# Patient Record
Sex: Female | Born: 1975 | Race: White | Hispanic: No | Smoking: Current every day smoker
Health system: Southern US, Community
[De-identification: ages and names within clinical notes are randomized; demographics above are authoritative.]

## PROBLEM LIST (undated history)

## (undated) DIAGNOSIS — I10 Essential (primary) hypertension: Secondary | ICD-10-CM

## (undated) DIAGNOSIS — I517 Cardiomegaly: Secondary | ICD-10-CM

## (undated) DIAGNOSIS — M7989 Other specified soft tissue disorders: Secondary | ICD-10-CM

## (undated) DIAGNOSIS — R110 Nausea: Secondary | ICD-10-CM

## (undated) DIAGNOSIS — R0789 Other chest pain: Secondary | ICD-10-CM

## (undated) DIAGNOSIS — R0600 Dyspnea, unspecified: Secondary | ICD-10-CM

## (undated) DIAGNOSIS — R0609 Other forms of dyspnea: Secondary | ICD-10-CM

## (undated) DIAGNOSIS — R079 Chest pain, unspecified: Secondary | ICD-10-CM

## (undated) HISTORY — DX: Chest pain, unspecified: R07.9

## (undated) HISTORY — DX: Nausea: R11.0

## (undated) HISTORY — DX: Other specified soft tissue disorders: M79.89

## (undated) HISTORY — DX: Dyspnea, unspecified: R06.00

## (undated) HISTORY — DX: Cardiomegaly: I51.7

## (undated) HISTORY — DX: Essential (primary) hypertension: I10

## (undated) HISTORY — DX: Other forms of dyspnea: R06.09

## (undated) HISTORY — DX: Other chest pain: R07.89

---
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
## 1996-11-16 HISTORY — PX: TUBAL LIGATION: SHX77

## 2001-04-18 HISTORY — PX: THYROIDECTOMY: SHX17

## 2012-04-18 HISTORY — PX: PAROTID GLAND TUMOR EXCISION: SHX5221

## 2017-08-16 HISTORY — PX: VAGINAL MASS EXCISION: SHX2640

## 2019-12-09 ENCOUNTER — Other Ambulatory Visit: Payer: Self-pay

## 2019-12-09 ENCOUNTER — Emergency Department (HOSPITAL_COMMUNITY)
Admission: EM | Admit: 2019-12-09 | Discharge: 2019-12-10 | Disposition: A | Discharge status: Home / Self Care | Payer: 59 | Attending: Emergency Medicine | Admitting: Emergency Medicine

## 2019-12-09 DIAGNOSIS — R0609 Other forms of dyspnea: Secondary | ICD-10-CM | POA: Diagnosis not present

## 2019-12-09 DIAGNOSIS — R609 Edema, unspecified: Secondary | ICD-10-CM | POA: Insufficient documentation

## 2019-12-09 DIAGNOSIS — Z20822 Contact with and (suspected) exposure to covid-19: Secondary | ICD-10-CM | POA: Diagnosis not present

## 2019-12-09 DIAGNOSIS — R0600 Dyspnea, unspecified: Secondary | ICD-10-CM

## 2019-12-09 DIAGNOSIS — R0789 Other chest pain: Secondary | ICD-10-CM | POA: Diagnosis not present

## 2019-12-10 ENCOUNTER — Emergency Department (HOSPITAL_COMMUNITY): Payer: 59

## 2019-12-10 ENCOUNTER — Other Ambulatory Visit: Payer: Self-pay

## 2019-12-10 LAB — BASIC METABOLIC PANEL
Anion gap: 12 (ref 5–15)
BUN: 11 mg/dL (ref 6–20)
CO2: 28 mmol/L (ref 22–32)
Calcium: 9.1 mg/dL (ref 8.9–10.3)
Chloride: 98 mmol/L (ref 98–111)
Creatinine, Ser: 0.92 mg/dL (ref 0.44–1.00)
GFR calc Af Amer: >60 mL/min (ref >60)
GFR calc non Af Amer: >60 mL/min (ref >60)
Glucose, Bld: 141 mg/dL — ABNORMAL HIGH (ref 70–99)
Potassium: 3 mmol/L — ABNORMAL LOW (ref 3.5–5.1)
Sodium: 138 mmol/L (ref 135–145)

## 2019-12-10 LAB — I-STAT BETA HCG BLOOD, ED (MC, WL, AP ONLY): I-stat hCG, quantitative: <5 m[IU]/mL (ref <5)

## 2019-12-10 LAB — SARS CORONAVIRUS 2 BY RT PCR (HOSPITAL ORDER, PERFORMED IN ~~LOC~~ HOSPITAL LAB): SARS Coronavirus 2: NEGATIVE

## 2019-12-10 LAB — BRAIN NATRIURETIC PEPTIDE: B Natriuretic Peptide: 32.4 pg/mL (ref 0.0–100.0)

## 2019-12-10 LAB — TROPONIN I (HIGH SENSITIVITY)
Troponin I (High Sensitivity): 4 ng/L (ref <18)
Troponin I (High Sensitivity): 5 ng/L (ref <18)
Troponin I (High Sensitivity): 3 ng/L (ref <18)

## 2019-12-10 LAB — CBC
HCT: 44.3 % (ref 36.0–46.0)
Hemoglobin: 14.9 g/dL (ref 12.0–15.0)
MCH: 31.2 pg (ref 26.0–34.0)
MCHC: 33.6 g/dL (ref 30.0–36.0)
MCV: 92.9 fL (ref 80.0–100.0)
Platelets: 296 10*3/uL (ref 150–400)
RBC: 4.77 MIL/uL (ref 3.87–5.11)
RDW: 14.2 % (ref 11.5–15.5)
WBC: 13.6 10*3/uL — ABNORMAL HIGH (ref 4.0–10.5)
nRBC: 0 % (ref 0.0–0.2)

## 2019-12-10 MED ORDER — NITROGLYCERIN 2 % TD OINT
0.5000 [in_us] | TOPICAL_OINTMENT | Freq: Once | TRANSDERMAL | Status: AC
  Administered 2019-12-10: 0.5 [in_us] via TOPICAL
  Filled 2019-12-10: qty 1

## 2019-12-10 NOTE — ED Triage Notes (Signed)
Pt presents to ED POV. Pt c/o nausea, HA, weakness, CP. Pt reports that it began x2d ago. Pt NAD in triage

## 2019-12-10 NOTE — Discharge Instructions (Signed)
Your testing today has been normal, there is no signs of heart attack, no signs of heart failure, please follow-up with the heart specialist listed above or the heart specialist of your choice within the next 72 hours, call for the next available follow-up but please return to the emergency department for worsening symptoms, I would also like you to start taking a baby aspirin daily

## 2019-12-10 NOTE — ED Provider Notes (Signed)
[facility] EMERGENCY DEPARTMENT Provider Note   CSN: [id number] Arrival date & time: 12/09/19  2103     History Chief Complaint  Patient presents with  . Chest Pain  . Nausea    Emily Horn is a 44 y.o. female.  HPI   This patient is a very pleasant 44 year old female, she has recently been diagnosed with hypertension and started on a lisinopril hydrochlorothiazide combination pill, she states that she smokes "at least 1 pack of cigarettes per day".  She has no other medical history other than a history of diabetes for which she states she was misdiagnosed and taken off medications.  She reports that she has recently developed several symptoms including some chest heaviness, shortness of breath and dyspnea on exertion as well as some swelling of her legs.  She went to the doctor for the first time to establish care at [facility] approximately 1-1/2 weeks ago during which time she states she had a low-grade fever.  She has not had any other significant infectious symptoms, no diarrhea dysuria nausea or vomiting.  She has had some occasional cough which she states is nonproductive.  She has not been tested for Covid in months, she has not had any rashes.  She has not had any prior work-up for cardiac disease.  She does report that she has some difficulty with climbing stairs and lives on the third floor thus making it difficult to get up and down.  This is not necessarily new, she does not exercise at all.  She has never had any provocative testing of her heart.  She does have a father who had significant heart disease in his 61s.  No past medical history on file.  There are no problems to display for this patient.     OB History   No obstetric history on file.     No family history on file.  Social History   Tobacco Use  . Smoking status: Not on file  Substance Use Topics  . Alcohol use: Not on file  . Drug use: Not on file    Home  Medications Prior to Admission medications   Not on File    Allergies    Aspirin and Geodon [ziprasidone]  Review of Systems   Review of Systems  All other systems reviewed and are negative.   Physical Exam Updated Vital Signs BP 114/72 (BP Location: Right Arm)   Pulse 72   Temp 98 F (36.7 C) (Oral)   Resp 15   Ht 1.702 m (5\' 7" )   Wt 122.5 kg   SpO2 96%   BMI 42.29 kg/m   Physical Exam Vitals and nursing note reviewed.  Constitutional:      General: She is not in acute distress.    Appearance: She is well-developed.  HENT:     Head: Normocephalic and atraumatic.     Mouth/Throat:     Pharynx: No oropharyngeal exudate.  Eyes:     General: No scleral icterus.       Right eye: No discharge.        Left eye: No discharge.     Conjunctiva/sclera: Conjunctivae normal.     Pupils: Pupils are equal, round, and reactive to light.  Neck:     Thyroid: No thyromegaly.     Vascular: No JVD.  Cardiovascular:     Rate and Rhythm: Normal rate and regular rhythm.     Heart sounds: Normal heart sounds. No murmur  heard.  No friction rub. No gallop.   Pulmonary:     Effort: Pulmonary effort is normal. No respiratory distress.     Breath sounds: Normal breath sounds. No wheezing or rales.  Abdominal:     General: Bowel sounds are normal. There is no distension.     Palpations: Abdomen is soft. There is no mass.     Tenderness: There is no abdominal tenderness.  Musculoskeletal:        General: No tenderness. Normal range of motion.     Cervical back: Normal range of motion and neck supple.     Right lower leg: No tenderness. Edema present.     Left lower leg: No tenderness. Edema present.  Lymphadenopathy:     Cervical: No cervical adenopathy.  Skin:    General: Skin is warm and dry.     Findings: No erythema or rash.  Neurological:     Mental Status: She is alert.     Coordination: Coordination normal.  Psychiatric:        Behavior: Behavior normal.     ED  Results / Procedures / Treatments   Labs (all labs ordered are listed, but only abnormal results are displayed) Labs Reviewed  BASIC METABOLIC PANEL - Abnormal; Notable for the following components:      Result Value   Potassium 3.0 (*)    Glucose, Bld 141 (*)    All other components within normal limits  CBC - Abnormal; Notable for the following components:   WBC 13.6 (*)    All other components within normal limits  SARS CORONAVIRUS 2 BY RT PCR (HOSPITAL ORDER, PERFORMED IN [facility])  BRAIN NATRIURETIC PEPTIDE  I-STAT BETA HCG BLOOD, ED ([facility], [facility], [facility] ONLY)  TROPONIN I (HIGH SENSITIVITY)  TROPONIN I (HIGH SENSITIVITY)  TROPONIN I (HIGH SENSITIVITY)    EKG EKG Interpretation  Date/Time:  Tuesday December 10 2019 11:52:51 EDT Ventricular Rate:  72 PR Interval:  170 QRS Duration: 105 QT Interval:  406 QTC Calculation: 445 R Axis:   55 Text Interpretation: Sinus rhythm Low voltage, precordial leads normal otherwise. Confirmed by Eber Hong ([id number]) on 12/10/2019 12:04:27 PM   Radiology DG Chest 2 View  Result Date: 12/10/2019 CLINICAL DATA:  Initial evaluation for acute chest pain. EXAM: CHEST - 2 VIEW COMPARISON:  None available. FINDINGS: Mild cardiomegaly.  Mediastinal silhouette within normal limits. Lungs normally inflated. Diffuse vascular and interstitial prominence is seen, favored to reflect mild diffuse pulmonary interstitial congestion. Sequelae of atypical/viral pneumonitis could also have this appearance. No frank airspace consolidation. No pleural effusion. No pneumothorax. No acute osseous finding. IMPRESSION: 1. Diffuse vascular and interstitial prominence, favored to reflect mild diffuse pulmonary interstitial congestion. Sequelae of atypical/viral pneumonitis could also have this appearance. 2. Mild cardiomegaly. Electronically Signed   By: Rise Mu M.D.   On: 12/10/2019 00:59    Procedures Procedures (including critical care  time)  Medications Ordered in ED Medications  nitroGLYCERIN (NITROGLYN) 2 % ointment 0.5 inch (0.5 inches Topical Given 12/10/19 1204)    ED Course  I have reviewed the triage vital signs and the nursing notes.  Pertinent labs & imaging results that were available during my care of the patient were reviewed by me and considered in my medical decision making (see chart for details).    MDM Rules/Calculators/A&P                          This  patient symptoms do raise concern for cardiac etiology with both dyspnea on exertion edema and chest heaviness.  She states that the chest heaviness had been intermittent with some occasional palpitations in the past but overnight it is been constant for at least 12 hours.  She has been waiting in the waiting room and during that time she has had multiple labs done including a troponin which came back at 5, she is not pregnant, her metabolic panel and CBC are unremarkable other than a mild leukocytosis of 13,000 and a potassium of 3.0.  She has a chest x-ray which I personally viewed and interpreted which shows some vascular congestion but no significant findings of pneumothorax or significant pulmonary edema effusion cardiomegaly or infiltrate.  At this time we will repeat the troponin, BNP, repeat EKG, she may need follow-up in the outpatient setting for stress test.  We will also rule out Covid  Covid test is negative, 3 troponins have all been negative, BNP is 32, no signs of complicating pulmonary conditions and EKGs which have been overall unremarkable without any evolving signs or symptoms of ischemia or arrhythmia.  At this time I feel the patient is stable for discharge, she has been given follow-up information for cardiology and can call to make an appointment within the next several days.  She understands indications for return.  She is agreeable, I have recommended that she stop smoking immediately  Tobacco cessation counseling given.  Final  Clinical Impression(s) / ED Diagnoses Final diagnoses:  Chest heaviness  Dyspnea on exertion    Rx / DC Orders ED Discharge Orders    None       Eber Hong, MD 12/10/19 1225

## 2019-12-30 ENCOUNTER — Other Ambulatory Visit: Payer: Self-pay | Admitting: *Deleted

## 2019-12-30 ENCOUNTER — Ambulatory Visit: Payer: 59 | Admitting: Internal Medicine

## 2019-12-30 ENCOUNTER — Encounter: Payer: Self-pay | Admitting: *Deleted

## 2019-12-30 NOTE — Progress Notes (Deleted)
Cardiology Office Note:    Date:  12/30/2019   ID:  Emily Horn, DOB [birth year], MRN [id number]  PCP:  [facility]  [facility] Cardiologist:  No primary care provider on file.  [facility] Electrophysiologist:  None   Referring MD: Pa, [facility]*   No chief complaint on file. ***  History of Present Illness:    Emily Horn is a 44 y.o. female with a hx of ***  Past Medical History:  Diagnosis Date  . Chest heaviness   . Chest pain   . Exertional dyspnea   . Hypertension   . Mild cardiomegaly   . Nausea   . Swelling of limb     *** The histories are not reviewed yet. Please review them in the "History" navigator section and refresh this SmartLink.  Current Medications: No outpatient medications have been marked as taking for the 12/30/19 encounter (Appointment) with Christell Constant, MD.     Allergies:   Aspirin and Geodon [ziprasidone]   Social History   Socioeconomic History  . Marital status: Single    Spouse name: Not on file  . Number of children: Not on file  . Years of education: Not on file  . Highest education level: Not on file  Occupational History  . Not on file  Tobacco Use  . Smoking status: Not on file  Substance and Sexual Activity  . Alcohol use: Not on file  . Drug use: Not on file  . Sexual activity: Not on file  Other Topics Concern  . Not on file  Social History Narrative  . Not on file   Social Determinants of Health   Financial Resource Strain:   . Difficulty of Paying Living Expenses: Not on file  Food Insecurity:   . Worried About Programme researcher, broadcasting/film/video in the Last Year: Not on file  . Ran Out of Food in the Last Year: Not on file  Transportation Needs:   . Lack of Transportation (Medical): Not on file  . Lack of Transportation (Non-Medical): Not on file  Physical Activity:   . Days of Exercise per Week: Not on file  . Minutes of Exercise per Session: Not on file  Stress:     . Feeling of Stress : Not on file  Social Connections:   . Frequency of Communication with Friends and Family: Not on file  . Frequency of Social Gatherings with Friends and Family: Not on file  . Attends Religious Services: Not on file  . Active Member of Clubs or Organizations: Not on file  . Attends Banker Meetings: Not on file  . Marital Status: Not on file     Family History: The patient's ***family history includes Heart disease in her father.  ROS:   Please see the history of present illness.    *** All other systems reviewed and are negative.  EKGs/Labs/Other Studies Reviewed:    The following studies were reviewed today: ***  EKG:  EKG is *** ordered today.  The ekg ordered today demonstrates *** 12/10/19- Sr, Nonspecific ST changes, rate 85  Recent Labs: 12/10/2019: B Natriuretic Peptide 32.4; BUN 11; Creatinine, Ser 0.92; Hemoglobin 14.9; Platelets 296; Potassium 3.0; Sodium 138  Recent Lipid Panel No results found for: CHOL, TRIG, HDL, CHOLHDL, VLDL, LDLCALC, LDLDIRECT  Physical Exam:    VS:  There were no vitals taken for this visit.    Wt Readings from Last 3 Encounters:  12/10/19 270  lb (122.5 kg)     GEN: *** Well nourished, well developed in no acute distress HEENT: Normal NECK: No JVD; No carotid bruits LYMPHATICS: No lymphadenopathy CARDIAC: ***RRR, no murmurs, rubs, gallops RESPIRATORY:  Clear to auscultation without rales, wheezing or rhonchi  ABDOMEN: Soft, non-tender, non-distended MUSCULOSKELETAL:  No edema; No deformity  SKIN: Warm and dry NEUROLOGIC:  Alert and oriented x 3 PSYCHIATRIC:  Normal affect   ASSESSMENT:    No diagnosis found. PLAN:    In order of problems listed above:  1. Peroperative surgical evaluation 2. Chest pain   Medication Adjustments/Labs and Tests Ordered: Current medicines are reviewed at length with the patient today.  Concerns regarding medicines are outlined above.  No orders of the  defined types were placed in this encounter.  No orders of the defined types were placed in this encounter.   There are no Patient Instructions on file for this visit.   Signed, Christell Constant, MD  12/30/2019 8:39 AM    [facility]

## 2020-02-06 ENCOUNTER — Ambulatory Visit: Payer: 59 | Admitting: Internal Medicine

## 2020-02-06 NOTE — Progress Notes (Deleted)
Cardiology Office Note:    Date:  02/06/2020   ID:  Emily Horn, DOB 1975/07/20, MRN [id number]  PCP:  [facility]  [facility] Cardiologist:  No primary care provider on file.  [facility] Electrophysiologist:  None   Referring MD: Pa, [facility]*   CC: Chest Pressure Consulted for the evaluation of chest pressure at the behest of Pa, [facility]  History of Present Illness:    Emily Horn is a 44 y.o. female with a hx of HTN who presents with chest pressure.  Patient notes ***.  Past Medical History:  Diagnosis Date  . Chest heaviness   . Chest pain   . Exertional dyspnea   . Hypertension   . Mild cardiomegaly   . Nausea   . Swelling of limb    Current Medications: No outpatient medications have been marked as taking for the 02/06/20 encounter (Appointment) with Christell Constant, MD.    Allergies:   Aspirin and Geodon [ziprasidone]   Social History   Socioeconomic History  . Marital status: Divorced    Spouse name: Not on file  . Number of children: 2  . Years of education: Not on file  . Highest education level: Not on file  Occupational History  . Occupation: home health aide    Employer: OTHER  Tobacco Use  . Smoking status: Current Every Day Smoker    Types: Cigarettes  . Smokeless tobacco: Never Used  Vaping Use  . Vaping Use: Never assessed  Substance and Sexual Activity  . Alcohol use: Yes  . Drug use: Yes  . Sexual activity: Not on file  Other Topics Concern  . Not on file  Social History Narrative  . Not on file   Social Determinants of Health   Financial Resource Strain:   . Difficulty of Paying Living Expenses: Not on file  Food Insecurity:   . Worried About Programme researcher, broadcasting/film/video in the Last Year: Not on file  . Ran Out of Food in the Last Year: Not on file  Transportation Needs:   . Lack of Transportation (Medical): Not on file  . Lack of Transportation (Non-Medical):  Not on file  Physical Activity:   . Days of Exercise per Week: Not on file  . Minutes of Exercise per Session: Not on file  Stress:   . Feeling of Stress : Not on file  Social Connections:   . Frequency of Communication with Friends and Family: Not on file  . Frequency of Social Gatherings with Friends and Family: Not on file  . Attends Religious Services: Not on file  . Active Member of Clubs or Organizations: Not on file  . Attends Banker Meetings: Not on file  . Marital Status: Not on file    Family History: The patient's family history includes Breast cancer in her maternal grandmother; Cancer in her brother; Colon cancer in her paternal grandmother; Diabetes in her father; Diabetes type II in her mother; Heart attack (age of onset: 77) in her father; Heart disease in her father and maternal grandfather; Hypercholesterolemia in her mother; Hypertension in her father and maternal grandfather; Throat cancer in her paternal grandfather; Vision loss in her father and mother.  ROS:   Please see the history of present illness.    All other systems reviewed and are negative.  EKGs/Labs/Other Studies Reviewed:    The following studies were reviewed today:  EKG:   SR rate 85 no  q waves  Recent Labs: 12/10/2019: B Natriuretic Peptide 32.4; BUN 11; Creatinine, Ser 0.92; Hemoglobin 14.9; Platelets 296; Potassium 3.0; Sodium 138   Physical Exam:    VS:  There were no vitals taken for this visit.    Wt Readings from Last 3 Encounters:  12/10/19 270 lb (122.5 kg)    GEN: Well nourished, well developed in no acute distress HEENT: Normal NECK: No JVD; No carotid bruits LYMPHATICS: No lymphadenopathy CARDIAC: RRR, no murmurs, rubs, gallops RESPIRATORY:  Clear to auscultation without rales, wheezing or rhonchi  ABDOMEN: Soft, non-tender, non-distended MUSCULOSKELETAL:  No edema; No deformity  SKIN: Warm and dry NEUROLOGIC:  Alert and oriented x 3 PSYCHIATRIC:  Normal  affect   ASSESSMENT:    No diagnosis found. PLAN:    In order of problems listed above:  Chest Pressure HTN -   Medication Adjustments/Labs and Tests Ordered: Current medicines are reviewed at length with the patient today.  Concerns regarding medicines are outlined above.  No orders of the defined types were placed in this encounter.  No orders of the defined types were placed in this encounter.   There are no Patient Instructions on file for this visit.   Signed, Christell Constant, MD  02/06/2020 10:23 AM    [facility]

## 2020-02-14 ENCOUNTER — Encounter: Payer: Self-pay | Admitting: General Practice

## 2020-06-04 ENCOUNTER — Emergency Department (HOSPITAL_COMMUNITY): Payer: Medicaid Other

## 2020-06-04 ENCOUNTER — Encounter (HOSPITAL_COMMUNITY): Payer: Self-pay

## 2020-06-04 ENCOUNTER — Emergency Department (HOSPITAL_COMMUNITY)
Admission: EM | Admit: 2020-06-04 | Discharge: 2020-06-04 | Disposition: A | Discharge status: Home / Self Care | Payer: Medicaid Other | Attending: Emergency Medicine | Admitting: Emergency Medicine

## 2020-06-04 ENCOUNTER — Other Ambulatory Visit: Payer: Self-pay

## 2020-06-04 DIAGNOSIS — F17210 Nicotine dependence, cigarettes, uncomplicated: Secondary | ICD-10-CM | POA: Insufficient documentation

## 2020-06-04 DIAGNOSIS — R21 Rash and other nonspecific skin eruption: Secondary | ICD-10-CM | POA: Insufficient documentation

## 2020-06-04 DIAGNOSIS — Z79899 Other long term (current) drug therapy: Secondary | ICD-10-CM | POA: Insufficient documentation

## 2020-06-04 DIAGNOSIS — I10 Essential (primary) hypertension: Secondary | ICD-10-CM | POA: Insufficient documentation

## 2020-06-04 LAB — COMPREHENSIVE METABOLIC PANEL
ALT: 12 U/L (ref 0–44)
AST: 19 U/L (ref 15–41)
Albumin: 3.8 g/dL (ref 3.5–5.0)
Alkaline Phosphatase: 53 U/L (ref 38–126)
Anion gap: 11 (ref 5–15)
BUN: 11 mg/dL (ref 6–20)
CO2: 27 mmol/L (ref 22–32)
Calcium: 10.1 mg/dL (ref 8.9–10.3)
Chloride: 98 mmol/L (ref 98–111)
Creatinine, Ser: 0.83 mg/dL (ref 0.44–1.00)
GFR, Estimated: >60 mL/min (ref >60)
Glucose, Bld: 170 mg/dL — ABNORMAL HIGH (ref 70–99)
Potassium: 3.3 mmol/L — ABNORMAL LOW (ref 3.5–5.1)
Sodium: 136 mmol/L (ref 135–145)
Total Bilirubin: 0.7 mg/dL (ref 0.3–1.2)
Total Protein: 6.7 g/dL (ref 6.5–8.1)

## 2020-06-04 LAB — CBC WITH DIFFERENTIAL/PLATELET
Abs Immature Granulocytes: 0.06 10*3/uL (ref 0.00–0.07)
Basophils Absolute: 0.1 10*3/uL (ref 0.0–0.1)
Basophils Relative: 0 %
Eosinophils Absolute: 0.7 10*3/uL — ABNORMAL HIGH (ref 0.0–0.5)
Eosinophils Relative: 5 %
HCT: 44.8 % (ref 36.0–46.0)
Hemoglobin: 14.9 g/dL (ref 12.0–15.0)
Immature Granulocytes: 0 %
Lymphocytes Relative: 25 %
Lymphs Abs: 3.7 10*3/uL (ref 0.7–4.0)
MCH: 31 pg (ref 26.0–34.0)
MCHC: 33.3 g/dL (ref 30.0–36.0)
MCV: 93.1 fL (ref 80.0–100.0)
Monocytes Absolute: 0.5 10*3/uL (ref 0.1–1.0)
Monocytes Relative: 4 %
Neutro Abs: 9.4 10*3/uL — ABNORMAL HIGH (ref 1.7–7.7)
Neutrophils Relative %: 66 %
Platelets: 277 10*3/uL (ref 150–400)
RBC: 4.81 MIL/uL (ref 3.87–5.11)
RDW: 14.1 % (ref 11.5–15.5)
WBC: 14.4 10*3/uL — ABNORMAL HIGH (ref 4.0–10.5)
nRBC: 0 % (ref 0.0–0.2)

## 2020-06-04 LAB — URINALYSIS, ROUTINE W REFLEX MICROSCOPIC
Bilirubin Urine: NEGATIVE
Glucose, UA: NEGATIVE mg/dL
Hgb urine dipstick: NEGATIVE
Ketones, ur: NEGATIVE mg/dL
Nitrite: NEGATIVE
Protein, ur: NEGATIVE mg/dL
Specific Gravity, Urine: 1.017 (ref 1.005–1.030)
pH: 5 (ref 5.0–8.0)

## 2020-06-04 LAB — I-STAT BETA HCG BLOOD, ED (MC, WL, AP ONLY): I-stat hCG, quantitative: <5 m[IU]/mL (ref <5)

## 2020-06-04 LAB — LACTIC ACID, PLASMA: Lactic Acid, Venous: 1.2 mmol/L (ref 0.5–1.9)

## 2020-06-04 MED ORDER — DIPHENHYDRAMINE HCL 25 MG PO TABS: 25.0000 mg | ORAL_TABLET | Freq: Four times a day (QID) | ORAL | 0 refills | Status: AC | PRN

## 2020-06-04 MED ORDER — PENICILLIN G BENZATHINE 1200000 UNIT/2ML IM SUSP
2.4000 10*6.[IU] | Freq: Once | INTRAMUSCULAR | Status: AC
  Administered 2020-06-04: 2.4 10*6.[IU] via INTRAMUSCULAR
  Filled 2020-06-04: qty 4

## 2020-06-04 MED ORDER — PREDNISONE 20 MG PO TABS: ORAL_TABLET | ORAL | 0 refills | Status: AC

## 2020-06-04 NOTE — Discharge Instructions (Signed)
You have been evaluated for your rash.  It is likely an allergic rash.  Take prednisone and benadryl as prescribed.  A syphilis test have been obtained, check result through MyChart, link below.  Continue taking Doxycycline previously prescribed.  Return if you have any concerns.

## 2020-06-04 NOTE — ED Notes (Signed)
Discharge instructions explained and patient verbalized understanding.  

## 2020-06-04 NOTE — ED Triage Notes (Signed)
Patient abscess to L thigh, reports it popped on its own and she went to her PCP who gave her doxy and cream to put on it. Reports he marked the redness with skin marker it is has spread beyond marking, now also reports rash to BLE.

## 2020-06-04 NOTE — ED Provider Notes (Signed)
[facility] EMERGENCY DEPARTMENT Provider Note   CSN: [id number] Arrival date & time: 06/04/20  8115     History Chief Complaint  Patient presents with  . Rash  . Abscess    Emily Horn is a 45 y.o. female.  The history is provided by the patient. No language interpreter was used.  Rash Abscess     45 year old female presenting for evaluation of a rash.  Patient report approximately a week ago she noticed a bump to her left groin region.  States the bump has been there longer however it begins to become more painful and red which concerns her.  She was seen by her PCP who draws a margin around that area and prescribed mupirocin cream as well as doxycycline antibiotic.  She did take medication and she noticed that the site has ruptured oozing out foul-smelling odor several days ago.  The reason that she is here is because she also noticed a new rash to her legs for nearly a week.  This rash started before taking the new antibiotic.  Rash is itchy and burning.  States she has had similar rash in the past usually brought on by her menstruation but this 1 is more intense.  She admits to changing her soap detergent on a regular basis and she is unsure if it contribute to her symptoms.  She denies any trouble breathing no wheezing no throat swelling or trouble swallowing no abdominal cramping.  Past Medical History:  Diagnosis Date  . Chest heaviness   . Chest pain   . Exertional dyspnea   . Hypertension   . Mild cardiomegaly   . Nausea   . Swelling of limb     There are no problems to display for this patient.   Past Surgical History:  Procedure Laterality Date  . 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 THYROIDECTOMY  04/2001  . TONSILLECTOMY AND ADENOIDECTOMY  08/1992  . TUBAL LIGATION  11/1996  . VAGINAL MASS EXCISION  08/2017     OB History   No obstetric history on file.     Family History  Problem Relation Age of Onset  . Heart disease  Father   . Heart attack Father 20  . Diabetes Father   . Vision loss Father   . Hypertension Father   . Vision loss Mother   . Diabetes type II Mother   . Hypercholesterolemia Mother   . Cancer Brother   . Breast cancer Maternal Grandmother   . Heart disease Maternal Grandfather   . Hypertension Maternal Grandfather   . Colon cancer Paternal Grandmother   . Throat cancer Paternal Grandfather     Social History   Tobacco Use  . Smoking status: Current Every Day Smoker    Types: Cigarettes  . Smokeless tobacco: Never Used  Substance Use Topics  . Alcohol use: Yes  . Drug use: Yes    Home Medications Prior to Admission medications   Medication Sig Start Date End Date Taking? Authorizing Provider  ARIPiprazole (ABILIFY) 30 MG tablet Take 30 mg by mouth daily. 12/25/19   [provider]  erythromycin ophthalmic ointment SMARTSIG:1 Application In Eye(s) 4 Times Daily 12/03/19   [provider]  levothyroxine (SYNTHROID) 200 MCG tablet Take 200 mcg by mouth every morning. 12/03/19   [provider]  lisinopril-hydrochlorothiazide (ZESTORETIC) 20-12.5 MG tablet Take 1 tablet by mouth daily. 12/24/19   [provider]  rosuvastatin (CRESTOR) 20 MG tablet Take 20  mg by mouth at bedtime. 12/24/19   [provider]  sertraline (ZOLOFT) 100 MG tablet Take 100 mg by mouth 2 (two) times daily. 12/24/19   [provider]    Allergies    Aspirin and Geodon [ziprasidone]  Review of Systems   Review of Systems  Skin: Positive for rash.  All other systems reviewed and are negative.   Physical Exam Updated Vital Signs BP 112/83   Pulse 75   Temp 98.2 F (36.8 C) (Oral)   Resp (!) 22   Ht 5\' 7"  (1.702 m)   Wt 122.5 kg   SpO2 93%   BMI 42.30 kg/m   Physical Exam Vitals and nursing note reviewed.  Constitutional:      General: She is not in acute distress.    Appearance: She is well-developed and well-nourished. She is obese.  HENT:      Head: Atraumatic.     Mouth/Throat:     Mouth: Mucous membranes are moist.     Comments: No oral mucosal rash Eyes:     Conjunctiva/sclera: Conjunctivae normal.  Cardiovascular:     Rate and Rhythm: Normal rate and regular rhythm.     Pulses: Normal pulses.     Heart sounds: Normal heart sounds.  Pulmonary:     Effort: Pulmonary effort is normal.     Breath sounds: Normal breath sounds.  Abdominal:     Palpations: Abdomen is soft.  Musculoskeletal:     Cervical back: Neck supple.  Skin:    Findings: Rash (Maculopapular rash noted throughout bilateral lower extremities with accompanying excoriation marks.  Rash also noticed to the pannus of abdomen, involving her feet and hands) present.     Comments: Moderately widespread erythema noted to left upper thigh without evidence of abscess.  Neurological:     Mental Status: She is alert and oriented to person, place, and time.  Psychiatric:        Mood and Affect: Mood and affect and mood normal.     ED Results / Procedures / Treatments   Labs (all labs ordered are listed, but only abnormal results are displayed) Labs Reviewed  COMPREHENSIVE METABOLIC PANEL - Abnormal; Notable for the following components:      Result Value   Potassium 3.3 (*)    Glucose, Bld 170 (*)    All other components within normal limits  CBC WITH DIFFERENTIAL/PLATELET - Abnormal; Notable for the following components:   WBC 14.4 (*)    Neutro Abs 9.4 (*)    Eosinophils Absolute 0.7 (*)    All other components within normal limits  URINALYSIS, ROUTINE W REFLEX MICROSCOPIC - Abnormal; Notable for the following components:   APPearance CLOUDY (*)    Leukocytes,Ua LARGE (*)    Bacteria, UA FEW (*)    All other components within normal limits  LACTIC ACID, PLASMA  RPR  I-STAT BETA HCG BLOOD, ED ([facility], [facility], [facility] ONLY)    EKG None  Radiology DG Chest 2 View  Result Date: 06/04/2020 CLINICAL DATA:  infection EXAM: CHEST - 2 VIEW COMPARISON:   12/10/2019 FINDINGS: Upper normal heart size. Unchanged mediastinal contours. Mild chronic bronchitic change, likely smoking related. No acute airspace disease. No pleural fluid or pneumothorax. No acute osseous abnormalities. IMPRESSION: No acute chest findings. Electronically Signed   By: 12/12/2019 M.D.   On: 06/04/2020 04:03    Procedures Procedures   Medications Ordered in ED Medications  penicillin g benzathine (BICILLIN LA) 1200000 UNIT/2ML injection 2.4  Million Units (2.4 Million Units Intramuscular Given 06/04/20 1023)    ED Course  I have reviewed the triage vital signs and the nursing notes.  Pertinent labs & imaging results that were available during my care of the patient were reviewed by me and considered in my medical decision making (see chart for details).    MDM Rules/Calculators/A&P                          BP 112/83   Pulse 75   Temp 98.2 F (36.8 C) (Oral)   Resp (!) 22   Ht 5\' 7"  (1.702 m)   Wt 122.5 kg   SpO2 93%   BMI 42.30 kg/m   Final Clinical Impression(s) / ED Diagnoses Final diagnoses:  Rash and nonspecific skin eruption    Rx / DC Orders ED Discharge Orders         Ordered    predniSONE (DELTASONE) 20 MG tablet        06/04/20 1053    diphenhydrAMINE (BENADRYL) 25 MG tablet  Every 6 hours PRN        06/04/20 1053         9:36 AM Patient here for recurrent itchy rash to her lower extremities and the pannus will be a week.  Was seen for skin infection of her left groin and was given antibiotic several days ago.  Rash started before the initiation of antibiotic.  Rash is itchy and likely related to contact dermatitis however there are some lesions noted in her feet and hands.  I discussed potential syphilis as a cause of her rash.  Patient does involve risky sexual practices, and would like to be tested and treated for potential syphilis.  Otherwise suspicion is low, out of abundance of precaution, will obtain RPR test, and will give  Bicillin 2.4 million Unit IM.  Patient will be going home with steroid, Benadryl for her itchy rash.  Will provide referral to allergist as well.   06/06/20, PA-C 06/04/20 1055    06/06/20, MD 06/06/20 2052

## 2020-06-05 LAB — RPR: RPR Ser Ql: NONREACTIVE

## 2020-06-16 ENCOUNTER — Emergency Department (HOSPITAL_COMMUNITY): Payer: Medicaid Other

## 2020-06-16 ENCOUNTER — Encounter (HOSPITAL_COMMUNITY): Payer: Self-pay | Admitting: Emergency Medicine

## 2020-06-16 ENCOUNTER — Emergency Department (HOSPITAL_COMMUNITY)
Admission: EM | Admit: 2020-06-16 | Discharge: 2020-06-17 | Disposition: A | Discharge status: Home / Self Care | Payer: Medicaid Other | Attending: Emergency Medicine | Admitting: Emergency Medicine

## 2020-06-16 ENCOUNTER — Other Ambulatory Visit: Payer: Self-pay

## 2020-06-16 DIAGNOSIS — R21 Rash and other nonspecific skin eruption: Secondary | ICD-10-CM | POA: Insufficient documentation

## 2020-06-16 DIAGNOSIS — R0602 Shortness of breath: Secondary | ICD-10-CM | POA: Insufficient documentation

## 2020-06-16 DIAGNOSIS — R0789 Other chest pain: Secondary | ICD-10-CM | POA: Insufficient documentation

## 2020-06-16 DIAGNOSIS — Z79899 Other long term (current) drug therapy: Secondary | ICD-10-CM | POA: Insufficient documentation

## 2020-06-16 DIAGNOSIS — Z8616 Personal history of COVID-19: Secondary | ICD-10-CM | POA: Insufficient documentation

## 2020-06-16 DIAGNOSIS — R110 Nausea: Secondary | ICD-10-CM | POA: Insufficient documentation

## 2020-06-16 DIAGNOSIS — R519 Headache, unspecified: Secondary | ICD-10-CM | POA: Insufficient documentation

## 2020-06-16 DIAGNOSIS — R5381 Other malaise: Secondary | ICD-10-CM | POA: Insufficient documentation

## 2020-06-16 DIAGNOSIS — Z20822 Contact with and (suspected) exposure to covid-19: Secondary | ICD-10-CM | POA: Insufficient documentation

## 2020-06-16 DIAGNOSIS — I10 Essential (primary) hypertension: Secondary | ICD-10-CM | POA: Insufficient documentation

## 2020-06-16 DIAGNOSIS — E860 Dehydration: Secondary | ICD-10-CM | POA: Insufficient documentation

## 2020-06-16 DIAGNOSIS — F17210 Nicotine dependence, cigarettes, uncomplicated: Secondary | ICD-10-CM | POA: Insufficient documentation

## 2020-06-16 DIAGNOSIS — E876 Hypokalemia: Secondary | ICD-10-CM | POA: Insufficient documentation

## 2020-06-16 DIAGNOSIS — R55 Syncope and collapse: Secondary | ICD-10-CM | POA: Insufficient documentation

## 2020-06-16 LAB — CBC WITH DIFFERENTIAL/PLATELET
Abs Immature Granulocytes: 0.07 10*3/uL (ref 0.00–0.07)
Basophils Absolute: 0.1 10*3/uL (ref 0.0–0.1)
Basophils Relative: 0 %
Eosinophils Absolute: 0.3 10*3/uL (ref 0.0–0.5)
Eosinophils Relative: 2 %
HCT: 45.4 % (ref 36.0–46.0)
Hemoglobin: 15.7 g/dL — ABNORMAL HIGH (ref 12.0–15.0)
Immature Granulocytes: 1 %
Lymphocytes Relative: 25 %
Lymphs Abs: 3.6 10*3/uL (ref 0.7–4.0)
MCH: 31.7 pg (ref 26.0–34.0)
MCHC: 34.6 g/dL (ref 30.0–36.0)
MCV: 91.5 fL (ref 80.0–100.0)
Monocytes Absolute: 0.6 10*3/uL (ref 0.1–1.0)
Monocytes Relative: 4 %
Neutro Abs: 10.3 10*3/uL — ABNORMAL HIGH (ref 1.7–7.7)
Neutrophils Relative %: 68 %
Platelets: 282 10*3/uL (ref 150–400)
RBC: 4.96 MIL/uL (ref 3.87–5.11)
RDW: 14.1 % (ref 11.5–15.5)
WBC: 14.8 10*3/uL — ABNORMAL HIGH (ref 4.0–10.5)
nRBC: 0 % (ref 0.0–0.2)

## 2020-06-16 LAB — COMPREHENSIVE METABOLIC PANEL
ALT: 13 U/L (ref 0–44)
AST: 23 U/L (ref 15–41)
Albumin: 4.3 g/dL (ref 3.5–5.0)
Alkaline Phosphatase: 64 U/L (ref 38–126)
Anion gap: 16 — ABNORMAL HIGH (ref 5–15)
BUN: 17 mg/dL (ref 6–20)
CO2: 24 mmol/L (ref 22–32)
Calcium: 10.3 mg/dL (ref 8.9–10.3)
Chloride: 99 mmol/L (ref 98–111)
Creatinine, Ser: 0.98 mg/dL (ref 0.44–1.00)
GFR, Estimated: >60 mL/min (ref >60)
Glucose, Bld: 140 mg/dL — ABNORMAL HIGH (ref 70–99)
Potassium: 2.7 mmol/L — CL (ref 3.5–5.1)
Sodium: 139 mmol/L (ref 135–145)
Total Bilirubin: 0.5 mg/dL (ref 0.3–1.2)
Total Protein: 7.3 g/dL (ref 6.5–8.1)

## 2020-06-16 LAB — TROPONIN I (HIGH SENSITIVITY): Troponin I (High Sensitivity): 3 ng/L (ref <18)

## 2020-06-16 MED ORDER — MAGNESIUM SULFATE 2 GM/50ML IV SOLN
2.0000 g | Freq: Once | INTRAVENOUS | Status: AC
  Administered 2020-06-16: 2 g via INTRAVENOUS
  Filled 2020-06-16: qty 50

## 2020-06-16 MED ORDER — POTASSIUM CHLORIDE 10 MEQ/100ML IV SOLN
10.0000 meq | Freq: Once | INTRAVENOUS | Status: AC
  Administered 2020-06-17: 10 meq via INTRAVENOUS
  Filled 2020-06-16: qty 100

## 2020-06-16 MED ORDER — POTASSIUM CHLORIDE 10 MEQ/100ML IV SOLN
10.0000 meq | Freq: Once | INTRAVENOUS | Status: AC
  Administered 2020-06-16: 10 meq via INTRAVENOUS
  Filled 2020-06-16: qty 100

## 2020-06-16 MED ORDER — ONDANSETRON HCL 4 MG/2ML IJ SOLN
4.0000 mg | Freq: Once | INTRAMUSCULAR | Status: AC
  Administered 2020-06-16: 4 mg via INTRAVENOUS
  Filled 2020-06-16: qty 2

## 2020-06-16 MED ORDER — SODIUM CHLORIDE 0.9 % IV BOLUS
1000.0000 mL | Freq: Once | INTRAVENOUS | Status: AC
  Administered 2020-06-16: 1000 mL via INTRAVENOUS

## 2020-06-16 MED ORDER — POTASSIUM CHLORIDE CRYS ER 20 MEQ PO TBCR
40.0000 meq | EXTENDED_RELEASE_TABLET | Freq: Once | ORAL | Status: AC
  Administered 2020-06-17: 40 meq via ORAL
  Filled 2020-06-16: qty 2

## 2020-06-16 NOTE — ED Notes (Signed)
Tubes sent down to lab:  Blood Cultures x1 Gold Light Green Dark Green Blue Lavender Gray 2 Golds

## 2020-06-16 NOTE — ED Provider Notes (Addendum)
[facility]-EMERGENCY DEPT Provider Note   CSN: [id number] Arrival date & time: 06/16/20  2040     History Chief Complaint  Patient presents with  . Nausea    Emily Horn is a 45 y.o. female past medical history of hypertension, presenting to the emergency department via EMS from [facility]  She states she was eating her meal when she began feeling lightheaded and near syncopal.  States some associated headache, nausea, chest tightness, and shortness of breath during this episode.  She denies any diaphoresis, double vision, numbness or weakness.  She states currently she still feels nausea and lightheaded with a headache we will.  Follow-up EMS noted soft blood pressures, she was given about 400 cc of IV fluid prior to arrival.  Denies neck pain or stiffness.  States over the last couple of weeks she has had cough and congestion.  She was Covid positive in the beginning of January.  Of note, patient states she is currently taking doxycycline for a rash on her leg and groin.  She was seen in the ED on 05/25/2018 1222 for rash.  On 06/09/2020 she followed with her PCP who extended her course of doxycycline.  She has no history of MRSA and recurrent abscess.  States the bumps are improving, redness persists.  No fevers or chills.  States she has been sweating at night.  Per review of medical record, RPR collected in the ED was nonreactive.  The history is provided by the patient and medical records.       Past Medical History:  Diagnosis Date  . Chest heaviness   . Chest pain   . Exertional dyspnea   . Hypertension   . Mild cardiomegaly   . Nausea   . Swelling of limb     There are no problems to display for this patient.   Past Surgical History:  Procedure Laterality Date  . 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 THYROIDECTOMY  04/2001  . TONSILLECTOMY AND ADENOIDECTOMY  08/1992  . TUBAL LIGATION  11/1996  . VAGINAL MASS EXCISION  08/2017      OB History   No obstetric history on file.     Family History  Problem Relation Age of Onset  . Heart disease Father   . Heart attack Father 73  . Diabetes Father   . Vision loss Father   . Hypertension Father   . Vision loss Mother   . Diabetes type II Mother   . Hypercholesterolemia Mother   . Cancer Brother   . Breast cancer Maternal Grandmother   . Heart disease Maternal Grandfather   . Hypertension Maternal Grandfather   . Colon cancer Paternal Grandmother   . Throat cancer Paternal Grandfather     Social History   Tobacco Use  . Smoking status: Current Every Day Smoker    Types: Cigarettes  . Smokeless tobacco: Never Used  Substance Use Topics  . Alcohol use: Yes  . Drug use: Yes    Home Medications Prior to Admission medications   Medication Sig Start Date End Date Taking? Authorizing Provider  ARIPiprazole (ABILIFY) 30 MG tablet Take 30 mg by mouth daily. 12/25/19   [provider]  diphenhydrAMINE (BENADRYL) 25 MG tablet Take 1 tablet (25 mg total) by mouth every 6 (six) hours as needed. 06/04/20   Fayrene Helper, PA-C  erythromycin ophthalmic ointment SMARTSIG:1 Application In Eye(s) 4 Times Daily 12/03/19   [provider]  levothyroxine (SYNTHROID) 200  MCG tablet Take 200 mcg by mouth every morning. 12/03/19   [provider]  lisinopril-hydrochlorothiazide (ZESTORETIC) 20-12.5 MG tablet Take 1 tablet by mouth daily. 12/24/19   [provider]  predniSONE (DELTASONE) 20 MG tablet 3 tabs po day one, then 2 po daily x 4 days 06/04/20   Fayrene Helper, PA-C  rosuvastatin (CRESTOR) 20 MG tablet Take 20 mg by mouth at bedtime. 12/24/19   [provider]  sertraline (ZOLOFT) 100 MG tablet Take 100 mg by mouth 2 (two) times daily. 12/24/19   [provider]    Allergies    Aspirin and Geodon [ziprasidone]  Review of Systems   Review of Systems  Constitutional: Negative for diaphoresis and fever.  Respiratory:  Positive for chest tightness and shortness of breath.   Gastrointestinal: Positive for nausea. Negative for vomiting.  Skin: Positive for rash.  Neurological: Positive for light-headedness and headaches.  All other systems reviewed and are negative.   Physical Exam Updated Vital Signs BP 106/61   Pulse 69   Temp 97.6 F (36.4 C) (Oral)   Resp 16   Ht 5\' 7"  (1.702 m)   Wt 122 kg   SpO2 93%   BMI 42.13 kg/m   Physical Exam Vitals and nursing note reviewed.  Constitutional:      General: She is not in acute distress.    Appearance: She is well-developed and well-nourished. She is not ill-appearing.  HENT:     Head: Normocephalic and atraumatic.  Eyes:     Extraocular Movements: Extraocular movements intact.     Conjunctiva/sclera: Conjunctivae normal.     Pupils: Pupils are equal, round, and reactive to light.  Cardiovascular:     Rate and Rhythm: Normal rate and regular rhythm.     Pulses: Normal pulses.  Pulmonary:     Effort: Pulmonary effort is normal. No respiratory distress.     Breath sounds: Normal breath sounds.  Abdominal:     General: Bowel sounds are normal.     Palpations: Abdomen is soft.     Tenderness: There is no abdominal tenderness. There is no guarding or rebound.  Skin:    General: Skin is warm.     Comments: Faint erythema noted to left medial thigh. Multiple small papules.   Neurological:     Mental Status: She is alert.     Comments: Speech is fluent without aphasia.  No facial droop.  Patient is alert and oriented, following commands without difficulty.  Equal strength to bilateral upper and lower extremities.  Normal sensation and tone.  Psychiatric:        Mood and Affect: Mood and affect normal.        Behavior: Behavior normal.     ED Results / Procedures / Treatments   Labs (all labs ordered are listed, but only abnormal results are displayed) Labs Reviewed  CBC WITH DIFFERENTIAL/PLATELET - Abnormal; Notable for the following  components:      Result Value   WBC 14.8 (*)    Hemoglobin 15.7 (*)    Neutro Abs 10.3 (*)    All other components within normal limits  COMPREHENSIVE METABOLIC PANEL  TROPONIN I (HIGH SENSITIVITY)    EKG None  Radiology DG Chest 2 View  Result Date: 06/16/2020 CLINICAL DATA:  Chest pain and shortness of breath EXAM: CHEST - 2 VIEW COMPARISON:  None. FINDINGS: The heart size and mediastinal contours are within normal limits. Both lungs are clear. The visualized skeletal structures are unremarkable.  IMPRESSION: No active cardiopulmonary disease. Electronically Signed   By: Jonna Clark M.D.   On: 06/16/2020 21:55    Procedures Procedures   Medications Ordered in ED Medications  sodium chloride 0.9 % bolus 1,000 mL (0 mLs Intravenous Stopped 06/16/20 2233)  ondansetron (ZOFRAN) injection 4 mg (4 mg Intravenous Given 06/16/20 2127)    ED Course  I have reviewed the triage vital signs and the nursing notes.  Pertinent labs & imaging results that were available during my care of the patient were reviewed by me and considered in my medical decision making (see chart for details).    MDM Rules/Calculators/A&P                          Patient presenting for near syncopal episode while eating dinner today. She endorses some associated chest tightness with shortness of breath, as well as headache and nausea. Chest symptoms have resolved. She still has some headache with nausea and lightheadedness. EMS noted blood pressure 80s over 40s, given 400cc IV fluids prior to arrival.  BP on evaluation is 110/80.  She has no focal neuro deficits.  Redness to the leg appears much improved in comparison to previous ED note.  She is afebrile with normal heart rate. Presentation seems less likely to be due to infection as cellulitis appears improved. Lungs are clear bilaterally.  No tachycardia. Low risk wells, patient is responsive to IVF, seems less likely PE.  Initial troponin is within normal limits.   Considered cardiac etiology: No arrhythmia is noted on ECG today.  Heart score 4.  Hemoglobin is elevated today at 15.7, consistent with mild dehydration.  Potassium is low at 2.7, this is replaced with two doses of IV potassium and 40 mEq of oral potassium.  Magnesium level is pending.  IV magnesium, 2 g also ordered.  Patient states that she has history of low potassium and is normally supposed to be taking supplement, however she did not have any at home.  She takes lisinopril.  No associated EKG changes today.  Pain to reevaluate after electrolyte repletion, orthostatic vital signs, repeat troponin.  Patient appears to be fluid responsive and reports to be feeling better on reevaluation.  If second troponin is negative, orthostatics are within normal limits and patient is able to ambulate without recurrent lightheadedness, believe she is appropriate for close outpatient follow-up. Will need potassium supplement and close recheck by PCP.   If persistently symptomatic, consider admission for continued electrolyte repletion and IV hydration.  Care assumed by [place], PA-C.  Discussed workup and care plan with attending physician, Dr. Myrtis Ser.  Final Clinical Impression(s) / ED Diagnoses Final diagnoses:  None    Rx / DC Orders ED Discharge Orders    None       Billee Balcerzak, [place] N, PA-C 06/17/20 0028    Myrtle Barnhard, [place] N, PA-C 06/17/20 0029    Sabino Donovan, MD 06/18/20 1154

## 2020-06-16 NOTE — ED Triage Notes (Signed)
Pt arrived via EMS from [facility]. Pt informed EMS that after she got her food from [facility] she had a sudden feeling that she was going to pass out. Pt reports that she did not eat any of the food. Pt has no known allergies. Pt reports sudden onset nausea, dizziness, and inability to control her breathing. 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 Pt is on doxycyline for MRSA.

## 2020-06-16 NOTE — ED Provider Notes (Signed)
Lightheaded, chest tight, SOB, headache No focal deficits Hypokalemia 2.7 Hypotension, 80s/40s per EMS On doxy for infection since 2/17, extended on 2/22 Trop neg (initial) Getting K+ - 2 runs IV, 40 PO Feeling better now  Pending orthostatics, delta, magnesium level Anticipate discharge  1:00 - introduction and recheck Second potassium 10 mEq run in process. Patient reports persistent nausea and headache. No vomiting. Lightheadedness is improved.   She reports "getting sick every year", feels similar to previous respiratory illnesses. Out of inhaler, will provide one here. COVID added for send out results.   Will recheck after potassium completed. Reglan/benadryl given for headache and nausea. Will reassess for improvement.   2:10 - Orthostatic VS improved. She continues to feel generally weak. Reports she feels no better. Labs reviewed, CXR reviewed. Patient appears clinically stable with the exception of potassium of 2.7 which has been supplemented; mild leukocytosis. She has an active cough - Albuterol inhaler provided.    2:55 - patient sleeping on recheck. VSS. Patient reassured her work up did not reveal life threatening conditions. She is prescribed potassium supplements but has not been taking them. She is encouraged to do this but will provide Rx as she is unsure where her medication is. She is felt appropriate for discharge home. Strict return precautions discussed and close PCP contact is recommended.   Diagnosis: 1. Dehydration 2. Hypokalemia 3. General malaise     Elpidio Anis, PA-C 06/17/20 0314    Reyana Libra, MD 06/17/20 [phone]

## 2020-06-17 LAB — TROPONIN I (HIGH SENSITIVITY): Troponin I (High Sensitivity): 3 ng/L (ref <18)

## 2020-06-17 LAB — SARS CORONAVIRUS 2 (TAT 6-24 HRS): SARS Coronavirus 2: NEGATIVE

## 2020-06-17 LAB — MAGNESIUM: Magnesium: 2.3 mg/dL (ref 1.7–2.4)

## 2020-06-17 MED ORDER — ALBUTEROL SULFATE HFA 108 (90 BASE) MCG/ACT IN AERS
2.0000 | INHALATION_SPRAY | Freq: Once | RESPIRATORY_TRACT | Status: AC
  Administered 2020-06-17: 2 via RESPIRATORY_TRACT
  Filled 2020-06-17: qty 6.7

## 2020-06-17 MED ORDER — METOCLOPRAMIDE HCL 5 MG/ML IJ SOLN
10.0000 mg | Freq: Once | INTRAMUSCULAR | Status: AC
  Administered 2020-06-17: 10 mg via INTRAVENOUS
  Filled 2020-06-17: qty 2

## 2020-06-17 MED ORDER — DIPHENHYDRAMINE HCL 50 MG/ML IJ SOLN
12.5000 mg | Freq: Once | INTRAMUSCULAR | Status: AC
  Administered 2020-06-17: 12.5 mg via INTRAVENOUS
  Filled 2020-06-17: qty 1

## 2020-06-17 MED ORDER — POTASSIUM CHLORIDE CRYS ER 20 MEQ PO TBCR: EXTENDED_RELEASE_TABLET | ORAL | 0 refills | Status: AC

## 2020-06-17 NOTE — ED Notes (Addendum)
Pt ambulatory to restroom. Pt noted to have a steady gait.

## 2020-06-17 NOTE — Discharge Instructions (Addendum)
Push fluids and get lots of rest. Call your doctor later today to schedule a recheck appointment for late this week or early next week.   As we discussed, you will need to continue taking potassium at home. A new prescription has been provided that will continue to correct the low level found tonight.    If you develop any worsening symptoms or new concerns, returning to the ED is appropriate and you are encouraged to come back.

## 2020-06-24 ENCOUNTER — Other Ambulatory Visit: Payer: Self-pay | Admitting: Internal Medicine

## 2020-06-24 DIAGNOSIS — Z1231 Encounter for screening mammogram for malignant neoplasm of breast: Secondary | ICD-10-CM

## 2020-08-18 ENCOUNTER — Ambulatory Visit
Admission: RE | Admit: 2020-08-18 | Discharge: 2020-08-18 | Disposition: A | Discharge status: Home / Self Care | Payer: 59 | Source: Ambulatory Visit | Attending: Internal Medicine | Admitting: Internal Medicine

## 2020-08-18 ENCOUNTER — Other Ambulatory Visit: Payer: Self-pay

## 2020-08-18 DIAGNOSIS — Z1231 Encounter for screening mammogram for malignant neoplasm of breast: Secondary | ICD-10-CM

## 2020-08-19 ENCOUNTER — Other Ambulatory Visit: Payer: Self-pay | Admitting: Internal Medicine

## 2020-08-19 DIAGNOSIS — R928 Other abnormal and inconclusive findings on diagnostic imaging of breast: Secondary | ICD-10-CM

## 2020-08-25 ENCOUNTER — Other Ambulatory Visit: Payer: Self-pay

## 2020-08-25 ENCOUNTER — Ambulatory Visit
Admission: RE | Admit: 2020-08-25 | Discharge: 2020-08-25 | Disposition: A | Discharge status: Home / Self Care | Payer: 59 | Source: Ambulatory Visit | Attending: Internal Medicine | Admitting: Internal Medicine

## 2020-08-25 ENCOUNTER — Ambulatory Visit: Payer: 59

## 2020-08-25 DIAGNOSIS — R928 Other abnormal and inconclusive findings on diagnostic imaging of breast: Secondary | ICD-10-CM

## 2020-09-16 ENCOUNTER — Emergency Department (HOSPITAL_COMMUNITY)
Admission: EM | Admit: 2020-09-16 | Discharge: 2020-09-17 | Disposition: A | Discharge status: Home / Self Care | Payer: 59 | Attending: Emergency Medicine | Admitting: Emergency Medicine

## 2020-09-16 ENCOUNTER — Other Ambulatory Visit: Payer: Self-pay

## 2020-09-16 ENCOUNTER — Emergency Department (HOSPITAL_COMMUNITY): Payer: 59

## 2020-09-16 ENCOUNTER — Encounter (HOSPITAL_COMMUNITY): Payer: Self-pay | Admitting: Emergency Medicine

## 2020-09-16 DIAGNOSIS — R102 Pelvic and perineal pain: Secondary | ICD-10-CM | POA: Diagnosis present

## 2020-09-16 DIAGNOSIS — N3090 Cystitis, unspecified without hematuria: Secondary | ICD-10-CM | POA: Insufficient documentation

## 2020-09-16 DIAGNOSIS — I10 Essential (primary) hypertension: Secondary | ICD-10-CM | POA: Diagnosis not present

## 2020-09-16 DIAGNOSIS — F17210 Nicotine dependence, cigarettes, uncomplicated: Secondary | ICD-10-CM | POA: Insufficient documentation

## 2020-09-16 DIAGNOSIS — Z79899 Other long term (current) drug therapy: Secondary | ICD-10-CM | POA: Diagnosis not present

## 2020-09-16 LAB — I-STAT BETA HCG BLOOD, ED (MC, WL, AP ONLY): I-stat hCG, quantitative: <5 m[IU]/mL (ref <5)

## 2020-09-16 LAB — COMPREHENSIVE METABOLIC PANEL
ALT: 17 U/L (ref 0–44)
AST: 30 U/L (ref 15–41)
Albumin: 4.6 g/dL (ref 3.5–5.0)
Alkaline Phosphatase: 63 U/L (ref 38–126)
Anion gap: 16 — ABNORMAL HIGH (ref 5–15)
BUN: 17 mg/dL (ref 6–20)
CO2: 20 mmol/L — ABNORMAL LOW (ref 22–32)
Calcium: 9.8 mg/dL (ref 8.9–10.3)
Chloride: 100 mmol/L (ref 98–111)
Creatinine, Ser: 1.19 mg/dL — ABNORMAL HIGH (ref 0.44–1.00)
GFR, Estimated: 57 mL/min — ABNORMAL LOW (ref >60)
Glucose, Bld: 128 mg/dL — ABNORMAL HIGH (ref 70–99)
Potassium: 3.5 mmol/L (ref 3.5–5.1)
Sodium: 136 mmol/L (ref 135–145)
Total Bilirubin: 0.5 mg/dL (ref 0.3–1.2)
Total Protein: 8 g/dL (ref 6.5–8.1)

## 2020-09-16 LAB — CBC
HCT: 45.4 % (ref 36.0–46.0)
Hemoglobin: 15.8 g/dL — ABNORMAL HIGH (ref 12.0–15.0)
MCH: 31.2 pg (ref 26.0–34.0)
MCHC: 34.8 g/dL (ref 30.0–36.0)
MCV: 89.5 fL (ref 80.0–100.0)
Platelets: 309 10*3/uL (ref 150–400)
RBC: 5.07 MIL/uL (ref 3.87–5.11)
RDW: 13.6 % (ref 11.5–15.5)
WBC: 14.4 10*3/uL — ABNORMAL HIGH (ref 4.0–10.5)
nRBC: 0 % (ref 0.0–0.2)

## 2020-09-16 LAB — LIPASE, BLOOD: Lipase: 22 U/L (ref 11–51)

## 2020-09-16 MED ORDER — HYDROMORPHONE HCL 1 MG/ML IJ SOLN
1.0000 mg | Freq: Once | INTRAMUSCULAR | Status: AC
Start: 2020-09-16 — End: 2020-09-17
  Administered 2020-09-17: 1 mg via INTRAVENOUS
  Filled 2020-09-16: qty 1

## 2020-09-16 MED ORDER — ONDANSETRON 4 MG PO TBDP: 4.0000 mg | ORAL_TABLET | Freq: Once | ORAL | Status: DC | PRN

## 2020-09-16 MED ORDER — ONDANSETRON HCL 4 MG/2ML IJ SOLN
4.0000 mg | Freq: Once | INTRAMUSCULAR | Status: AC
  Administered 2020-09-17: 4 mg via INTRAVENOUS
  Filled 2020-09-16: qty 2

## 2020-09-16 NOTE — ED Triage Notes (Signed)
Im of of fentanyl. Patient brought in by [facility]. Patient complaining of lower abdominal pain. This started around 1930.

## 2020-09-16 NOTE — ED Provider Notes (Signed)
Emergency Medicine Provider Triage Evaluation Note  Emily Horn , a 45 y.o. female  was evaluated in triage.  Pt complains of lower abdominal pain that began today.  Denies any pelvic complaints or vomiting.  Review of Systems  Positive: Abdominal pain Negative: Vomiting  Physical Exam  BP (!) 154/102 (BP Location: Left Arm)   Pulse 93   Temp 97.8 F (36.6 C) (Oral)   Resp 18   Ht 5\' 7"  (1.702 m)   Wt 127 kg   LMP 09/14/2020   SpO2 94%   BMI 43.85 kg/m  Gen:   Awake  Resp:  Normal effort  MSK:   Moves extremities without difficulty  Other:  Appears uncomfortable, holding abdomen  Medical Decision Making  Medically screening exam initiated at 9:15 PM.  Appropriate orders placed.  Bernette Cox Rhome was informed that the remainder of the evaluation will be completed by another provider, this initial triage assessment does not replace that evaluation, and the importance of remaining in the ED until their evaluation is complete.  Lab work ordered   Gunnar Fusi, PA-C 09/16/20 2116    2117, MD 09/16/20 [phone]

## 2020-09-16 NOTE — ED Provider Notes (Signed)
[facility]-EMERGENCY DEPT Provider Note   CSN: [id number] Arrival date & time: 09/16/20  2039     History Chief Complaint  Patient presents with  . Abdominal Pain    Emily Horn is a 45 y.o. female.  Patient presents to the emergency department for evaluation of lower abdominal and pelvic pain.  Symptoms ongoing for a number of hours.  She does report that she did have some pain in her back initially but now the pain is in the low pelvic area.  It is sharp and also feels like a pressure.        Past Medical History:  Diagnosis Date  . Chest heaviness   . Chest pain   . Exertional dyspnea   . Hypertension   . Mild cardiomegaly   . Nausea   . Swelling of limb     There are no problems to display for this patient.   Past Surgical History:  Procedure Laterality Date  . 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 THYROIDECTOMY  04/2001  . TONSILLECTOMY AND ADENOIDECTOMY  08/1992  . TUBAL LIGATION  11/1996  . VAGINAL MASS EXCISION  08/2017     OB History   No obstetric history on file.     Family History  Problem Relation Age of Onset  . Heart disease Father   . Heart attack Father 10  . Diabetes Father   . Vision loss Father   . Hypertension Father   . Vision loss Mother   . Diabetes type II Mother   . Hypercholesterolemia Mother   . Cancer Brother   . Breast cancer Maternal Grandmother 81  . Heart disease Maternal Grandfather   . Hypertension Maternal Grandfather   . Colon cancer Paternal Grandmother   . Throat cancer Paternal Grandfather     Social History   Tobacco Use  . Smoking status: Current Every Day Smoker    Types: Cigarettes  . Smokeless tobacco: Never Used  Substance Use Topics  . Alcohol use: Yes  . Drug use: Yes    Home Medications Prior to Admission medications   Medication Sig Start Date End Date Taking? Authorizing Provider  ARIPiprazole (ABILIFY) 30 MG tablet Take 30 mg by mouth daily. 12/25/19    [provider]  diphenhydrAMINE (BENADRYL) 25 MG tablet Take 1 tablet (25 mg total) by mouth every 6 (six) hours as needed. 06/04/20   Fayrene Helper, PA-C  doxycycline (VIBRAMYCIN) 100 MG capsule Take 100 mg by mouth 2 (two) times daily. 06/09/20   [provider]  levothyroxine (SYNTHROID) 175 MCG tablet Take 175 mcg by mouth every morning. 12/03/19   [provider]  lisinopril-hydrochlorothiazide (ZESTORETIC) 20-12.5 MG tablet Take 1 tablet by mouth daily. 12/24/19   [provider]  potassium chloride SA (KLOR-CON) 20 MEQ tablet Take one tablet twice daily for the next 5 days, then once daily after that 06/17/20   Elpidio Anis, PA-C  predniSONE (DELTASONE) 20 MG tablet 3 tabs po day one, then 2 po daily x 4 days Patient not taking: Reported on 06/17/2020 06/04/20   Fayrene Helper, PA-C  rosuvastatin (CRESTOR) 20 MG tablet Take 20 mg by mouth daily. 12/24/19   [provider]  sertraline (ZOLOFT) 100 MG tablet Take 100 mg by mouth 2 (two) times daily. 12/24/19   [provider]    Allergies    Aspirin and Geodon [ziprasidone]  Review of Systems   Review of Systems  Gastrointestinal: Positive for abdominal  pain.  Genitourinary: Positive for pelvic pain.  All other systems reviewed and are negative.   Physical Exam Updated Vital Signs BP (!) 151/103   Pulse 68   Temp 97.8 F (36.6 C) (Oral)   Resp 18   Ht 5\' 7"  (1.702 m)   Wt 127 kg   LMP 09/14/2020   SpO2 93%   BMI 43.85 kg/m   Physical Exam Vitals and nursing note reviewed.  Constitutional:      General: She is not in acute distress.    Appearance: Normal appearance. She is well-developed.  HENT:     Head: Normocephalic and atraumatic.     Right Ear: Hearing normal.     Left Ear: Hearing normal.     Nose: Nose normal.  Eyes:     Conjunctiva/sclera: Conjunctivae normal.     Pupils: Pupils are equal, round, and reactive to light.  Cardiovascular:     Rate and Rhythm: Regular  rhythm.     Heart sounds: S1 normal and S2 normal. No murmur heard. No friction rub. No gallop.   Pulmonary:     Effort: Pulmonary effort is normal. No respiratory distress.     Breath sounds: Normal breath sounds.  Chest:     Chest wall: No tenderness.  Abdominal:     General: Bowel sounds are normal.     Palpations: Abdomen is soft.     Tenderness: There is no abdominal tenderness. There is no guarding or rebound. Negative signs include Murphy's sign and McBurney's sign.     Hernia: No hernia is present.  Musculoskeletal:        General: Normal range of motion.     Cervical back: Normal range of motion and neck supple.  Skin:    General: Skin is warm and dry.     Findings: No rash.  Neurological:     Mental Status: She is alert and oriented to person, place, and time.     GCS: GCS eye subscore is 4. GCS verbal subscore is 5. GCS motor subscore is 6.     Cranial Nerves: No cranial nerve deficit.     Sensory: No sensory deficit.     Coordination: Coordination normal.  Psychiatric:        Speech: Speech normal.        Behavior: Behavior normal.        Thought Content: Thought content normal.     ED Results / Procedures / Treatments   Labs (all labs ordered are listed, but only abnormal results are displayed) Labs Reviewed  COMPREHENSIVE METABOLIC PANEL - Abnormal; Notable for the following components:      Result Value   CO2 20 (*)    Glucose, Bld 128 (*)    Creatinine, Ser 1.19 (*)    GFR, Estimated 57 (*)    Anion gap 16 (*)    All other components within normal limits  CBC - Abnormal; Notable for the following components:   WBC 14.4 (*)    Hemoglobin 15.8 (*)    All other components within normal limits  URINALYSIS, ROUTINE W REFLEX MICROSCOPIC - Abnormal; Notable for the following components:   APPearance HAZY (*)    Hgb urine dipstick SMALL (*)    Ketones, ur 5 (*)    Protein, ur 30 (*)    Nitrite POSITIVE (*)    Leukocytes,Ua MODERATE (*)    WBC, UA >50 (*)     Bacteria, UA MANY (*)    All other components  within normal limits  LIPASE, BLOOD  I-STAT BETA HCG BLOOD, ED ([facility], [facility], [facility] ONLY)    EKG None  Radiology CT RENAL STONE STUDY  Result Date: 09/17/2020 CLINICAL DATA:  Flank pain lower abdominal pain EXAM: CT ABDOMEN AND PELVIS WITHOUT CONTRAST TECHNIQUE: Multidetector CT imaging of the abdomen and pelvis was performed following the standard protocol without IV contrast. COMPARISON:  None. FINDINGS: Lower chest: Lung bases demonstrate no acute consolidation or effusion. Normal cardiac size. Hepatobiliary: No focal liver abnormality is seen. No gallstones, gallbladder wall thickening, or biliary dilatation. Pancreas: Unremarkable. No pancreatic ductal dilatation or surrounding inflammatory changes. Spleen: Normal in size without focal abnormality. Adrenals/Urinary Tract: Adrenal glands are unremarkable. Kidneys are normal, without renal calculi, focal lesion, or hydronephrosis. Bladder is unremarkable. Stomach/Bowel: Stomach is within normal limits. Appendix appears normal. No evidence of bowel wall thickening, distention, or inflammatory changes. Vascular/Lymphatic: Mild aortic atherosclerosis. No aneurysm. No suspicious nodes. Reproductive: Uterus and bilateral adnexa are unremarkable. Other: Negative for free air or free fluid. Musculoskeletal: No acute or significant osseous findings. IMPRESSION: No CT evidence for acute intra-abdominal or pelvic abnormality. Electronically Signed   By: Jasmine Pang M.D.   On: 09/17/2020 00:01    Procedures Procedures   Medications Ordered in ED Medications  ondansetron (ZOFRAN-ODT) disintegrating tablet 4 mg (has no administration in time range)  cefTRIAXone (ROCEPHIN) 1 g in sodium chloride 0.9 % 100 mL IVPB (has no administration in time range)  belladonna-opium (B&O) suppository 16.2-60mg  (has no administration in time range)  HYDROmorphone (DILAUDID) injection 1 mg (1 mg Intravenous Given 09/17/20 0008)   ondansetron (ZOFRAN) injection 4 mg (4 mg Intravenous Given 09/17/20 0020)  sodium chloride 0.9 % bolus 1,000 mL (1,000 mLs Intravenous New Bag/Given 09/17/20 0341)    ED Course  I have reviewed the triage vital signs and the nursing notes.  Pertinent labs & imaging results that were available during my care of the patient were reviewed by me and considered in my medical decision making (see chart for details).    MDM Rules/Calculators/A&P                          Patient presents to the emergency department with discomfort in the suprapubic area.  She had some lower back discomfort earlier but this has resolved.  Patient complaining of a constant pain and severe pressure.  Blood work is unremarkable.  CT scan unremarkable.  Urinalysis shows obvious infection.  Presentation consistent with cystitis with bladder spasm.  Treated with Rocephin and BNO suppository.  We will continue outpatient antibiotics and bladder spasm agent.  Final Clinical Impression(s) / ED Diagnoses Final diagnoses:  Cystitis    Rx / DC Orders ED Discharge Orders    None       Custer Pimenta, Canary Brim, MD 09/17/20 [phone]

## 2020-09-17 LAB — URINALYSIS, ROUTINE W REFLEX MICROSCOPIC
Bilirubin Urine: NEGATIVE
Glucose, UA: NEGATIVE mg/dL
Ketones, ur: 5 mg/dL — AB
Nitrite: POSITIVE — AB
Protein, ur: 30 mg/dL — AB
Specific Gravity, Urine: 1.021 (ref 1.005–1.030)
WBC, UA: >50 WBC/hpf — ABNORMAL HIGH (ref 0–5)
pH: 5 (ref 5.0–8.0)

## 2020-09-17 MED ORDER — BELLADONNA ALKALOIDS-OPIUM 16.2-60 MG RE SUPP
1.0000 | Freq: Once | RECTAL | Status: AC
  Administered 2020-09-17: 1 via RECTAL
  Filled 2020-09-17: qty 1

## 2020-09-17 MED ORDER — CEPHALEXIN 500 MG PO CAPS: 500.0000 mg | ORAL_CAPSULE | Freq: Two times a day (BID) | ORAL | 0 refills | Status: AC

## 2020-09-17 MED ORDER — SODIUM CHLORIDE 0.9 % IV BOLUS
1000.0000 mL | Freq: Once | INTRAVENOUS | Status: AC
  Administered 2020-09-17: 1000 mL via INTRAVENOUS

## 2020-09-17 MED ORDER — SODIUM CHLORIDE 0.9 % IV SOLN
1.0000 g | Freq: Once | INTRAVENOUS | Status: AC
  Administered 2020-09-17: 1 g via INTRAVENOUS
  Filled 2020-09-17: qty 10

## 2020-09-17 MED ORDER — KETOROLAC TROMETHAMINE 30 MG/ML IJ SOLN
15.0000 mg | Freq: Once | INTRAMUSCULAR | Status: AC
  Administered 2020-09-17: 15 mg via INTRAVENOUS
  Filled 2020-09-17: qty 1

## 2020-09-17 MED ORDER — PHENAZOPYRIDINE HCL 200 MG PO TABS: 200.0000 mg | ORAL_TABLET | Freq: Three times a day (TID) | ORAL | 0 refills | Status: AC | PRN

## 2020-09-17 NOTE — ED Notes (Signed)
Discharged medications reviewed and avs given no concerns at this time.

## 2020-09-18 ENCOUNTER — Other Ambulatory Visit: Payer: Self-pay

## 2020-09-18 ENCOUNTER — Emergency Department (HOSPITAL_COMMUNITY)
Admission: EM | Admit: 2020-09-18 | Discharge: 2020-09-18 | Disposition: A | Discharge status: Home / Self Care | Payer: 59 | Attending: Emergency Medicine | Admitting: Emergency Medicine

## 2020-09-18 DIAGNOSIS — F22 Delusional disorders: Secondary | ICD-10-CM | POA: Diagnosis not present

## 2020-09-18 DIAGNOSIS — I10 Essential (primary) hypertension: Secondary | ICD-10-CM | POA: Diagnosis not present

## 2020-09-18 DIAGNOSIS — F17210 Nicotine dependence, cigarettes, uncomplicated: Secondary | ICD-10-CM | POA: Insufficient documentation

## 2020-09-18 DIAGNOSIS — F341 Dysthymic disorder: Secondary | ICD-10-CM | POA: Diagnosis present

## 2020-09-18 DIAGNOSIS — F32A Depression, unspecified: Secondary | ICD-10-CM | POA: Insufficient documentation

## 2020-09-18 DIAGNOSIS — Z79899 Other long term (current) drug therapy: Secondary | ICD-10-CM | POA: Diagnosis not present

## 2020-09-18 LAB — CBC WITH DIFFERENTIAL/PLATELET
Abs Immature Granulocytes: 0.02 10*3/uL (ref 0.00–0.07)
Basophils Absolute: 0.1 10*3/uL (ref 0.0–0.1)
Basophils Relative: 0 %
Eosinophils Absolute: 0.3 10*3/uL (ref 0.0–0.5)
Eosinophils Relative: 3 %
HCT: 45.2 % (ref 36.0–46.0)
Hemoglobin: 15.2 g/dL — ABNORMAL HIGH (ref 12.0–15.0)
Immature Granulocytes: 0 %
Lymphocytes Relative: 28 %
Lymphs Abs: 3.5 10*3/uL (ref 0.7–4.0)
MCH: 31 pg (ref 26.0–34.0)
MCHC: 33.6 g/dL (ref 30.0–36.0)
MCV: 92.1 fL (ref 80.0–100.0)
Monocytes Absolute: 0.5 10*3/uL (ref 0.1–1.0)
Monocytes Relative: 4 %
Neutro Abs: 8.3 10*3/uL — ABNORMAL HIGH (ref 1.7–7.7)
Neutrophils Relative %: 65 %
Platelets: 317 10*3/uL (ref 150–400)
RBC: 4.91 MIL/uL (ref 3.87–5.11)
RDW: 13.7 % (ref 11.5–15.5)
WBC: 12.6 10*3/uL — ABNORMAL HIGH (ref 4.0–10.5)
nRBC: 0 % (ref 0.0–0.2)

## 2020-09-18 LAB — COMPREHENSIVE METABOLIC PANEL
ALT: 12 U/L (ref 0–44)
AST: 22 U/L (ref 15–41)
Albumin: 4.5 g/dL (ref 3.5–5.0)
Alkaline Phosphatase: 55 U/L (ref 38–126)
Anion gap: 10 (ref 5–15)
BUN: 16 mg/dL (ref 6–20)
CO2: 27 mmol/L (ref 22–32)
Calcium: 9.4 mg/dL (ref 8.9–10.3)
Chloride: 101 mmol/L (ref 98–111)
Creatinine, Ser: 0.86 mg/dL (ref 0.44–1.00)
GFR, Estimated: >60 mL/min (ref >60)
Glucose, Bld: 97 mg/dL (ref 70–99)
Potassium: 3.1 mmol/L — ABNORMAL LOW (ref 3.5–5.1)
Sodium: 138 mmol/L (ref 135–145)
Total Bilirubin: 0.6 mg/dL (ref 0.3–1.2)
Total Protein: 7.5 g/dL (ref 6.5–8.1)

## 2020-09-18 LAB — I-STAT BETA HCG BLOOD, ED (MC, WL, AP ONLY): I-stat hCG, quantitative: <5 m[IU]/mL (ref <5)

## 2020-09-18 LAB — ETHANOL: Alcohol, Ethyl (B): <10 mg/dL (ref <10)

## 2020-09-18 NOTE — ED Triage Notes (Signed)
Pt came from home via POV. C/c: assistance with psychiatric care and medications. Pt reports being prescribed antidepressants in the past but has not been on any medications for months. Pt reports severe depression x5 months that causes her to be unable to perform ADL's. Pt states "I have no enjoyment in anything anymore, it's hard for me to even bathe myself sometimes. I just need some help". Pt presents today d/t new onset of paranoia, pt states "I feel like everyone is out to get me". Pt denies SI or HI or presence of auditory and visual hallucinations

## 2020-09-18 NOTE — ED Provider Notes (Signed)
[facility]-EMERGENCY DEPT Provider Note   CSN: [id number] Arrival date & time: 09/18/20  1945     History Chief Complaint  Patient presents with  . Psychiatric Evaluation    Emily Horn is a 45 y.o. female.  Patient is dealing with dysphoric mood lack of concentration lack of appetite anhedonia.  No suicidal ideation no homicidal ideation no hallucinations.  She is having some paranoid that everybody is out to get her.  History of depression no longer taking meds.  Felt like her meds were helping.        Past Medical History:  Diagnosis Date  . Chest heaviness   . Chest pain   . Exertional dyspnea   . Hypertension   . Mild cardiomegaly   . Nausea   . Swelling of limb     There are no problems to display for this patient.   Past Surgical History:  Procedure Laterality Date  . 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 THYROIDECTOMY  04/2001  . TONSILLECTOMY AND ADENOIDECTOMY  08/1992  . TUBAL LIGATION  11/1996  . VAGINAL MASS EXCISION  08/2017     OB History   No obstetric history on file.     Family History  Problem Relation Age of Onset  . Heart disease Father   . Heart attack Father 17  . Diabetes Father   . Vision loss Father   . Hypertension Father   . Vision loss Mother   . Diabetes type II Mother   . Hypercholesterolemia Mother   . Cancer Brother   . Breast cancer Maternal Grandmother 77  . Heart disease Maternal Grandfather   . Hypertension Maternal Grandfather   . Colon cancer Paternal Grandmother   . Throat cancer Paternal Grandfather     Social History   Tobacco Use  . Smoking status: Current Every Day Smoker    Types: Cigarettes  . Smokeless tobacco: Never Used  Substance Use Topics  . Alcohol use: Yes  . Drug use: Yes    Home Medications Prior to Admission medications   Medication Sig Start Date End Date Taking? Authorizing Provider  ARIPiprazole (ABILIFY) 30 MG tablet Take 30 mg by mouth daily.  12/25/19   [provider]  cephALEXin (KEFLEX) 500 MG capsule Take 1 capsule (500 mg total) by mouth 2 (two) times daily. 09/17/20   Gilda Crease, MD  diphenhydrAMINE (BENADRYL) 25 MG tablet Take 1 tablet (25 mg total) by mouth every 6 (six) hours as needed. 06/04/20   Fayrene Helper, PA-C  doxycycline (VIBRAMYCIN) 100 MG capsule Take 100 mg by mouth 2 (two) times daily. 06/09/20   [provider]  levothyroxine (SYNTHROID) 175 MCG tablet Take 175 mcg by mouth every morning. 12/03/19   [provider]  lisinopril-hydrochlorothiazide (ZESTORETIC) 20-12.5 MG tablet Take 1 tablet by mouth daily. 12/24/19   [provider]  phenazopyridine (PYRIDIUM) 200 MG tablet Take 1 tablet (200 mg total) by mouth 3 (three) times daily as needed for pain. 09/17/20   Gilda Crease, MD  potassium chloride SA (KLOR-CON) 20 MEQ tablet Take one tablet twice daily for the next 5 days, then once daily after that 06/17/20   Elpidio Anis, PA-C  predniSONE (DELTASONE) 20 MG tablet 3 tabs po day one, then 2 po daily x 4 days Patient not taking: Reported on 06/17/2020 06/04/20   Fayrene Helper, PA-C  rosuvastatin (CRESTOR) 20 MG tablet Take 20 mg by mouth daily. 12/24/19   [provider]  sertraline (ZOLOFT) 100 MG tablet Take 100 mg by mouth 2 (two) times daily. 12/24/19   [provider]    Allergies    Aspirin and Geodon [ziprasidone]  Review of Systems   Review of Systems  Constitutional: Negative for chills and fever.  HENT: Negative for congestion and rhinorrhea.   Respiratory: Negative for cough and shortness of breath.   Cardiovascular: Negative for chest pain and palpitations.  Gastrointestinal: Negative for diarrhea, nausea and vomiting.  Genitourinary: Negative for difficulty urinating and dysuria.  Musculoskeletal: Negative for arthralgias and back pain.  Skin: Negative for rash and wound.  Neurological: Negative for light-headedness and headaches.   Psychiatric/Behavioral: Positive for dysphoric mood. Negative for suicidal ideas.    Physical Exam Updated Vital Signs BP (!) 161/99   Pulse 72   Temp 98.4 F (36.9 C) (Oral)   Resp 18   Ht 5\' 7"  (1.702 m)   Wt 127 kg   LMP 09/14/2020   SpO2 96%   BMI 43.85 kg/m   Physical Exam Vitals and nursing note reviewed. Exam conducted with a chaperone present.  Constitutional:      General: She is not in acute distress.    Appearance: Normal appearance.  HENT:     Head: Normocephalic and atraumatic.     Nose: No rhinorrhea.  Eyes:     General:        Right eye: No discharge.        Left eye: No discharge.     Conjunctiva/sclera: Conjunctivae normal.  Cardiovascular:     Rate and Rhythm: Normal rate and regular rhythm.  Pulmonary:     Effort: Pulmonary effort is normal. No respiratory distress.     Breath sounds: No stridor.  Abdominal:     General: Abdomen is flat. There is no distension.     Palpations: Abdomen is soft.  Musculoskeletal:        General: No tenderness or signs of injury.  Skin:    General: Skin is warm and dry.  Neurological:     General: No focal deficit present.     Mental Status: She is alert. Mental status is at baseline.     Motor: No weakness.  Psychiatric:        Mood and Affect: Mood normal.        Behavior: Behavior normal.        Thought Content: Thought content is paranoid. Thought content is not delusional. Thought content does not include homicidal or suicidal ideation. Thought content does not include homicidal or suicidal plan.     ED Results / Procedures / Treatments   Labs (all labs ordered are listed, but only abnormal results are displayed) Labs Reviewed  COMPREHENSIVE METABOLIC PANEL - Abnormal; Notable for the following components:      Result Value   Potassium 3.1 (*)    All other components within normal limits  CBC WITH DIFFERENTIAL/PLATELET - Abnormal; Notable for the following components:   WBC 12.6 (*)    Hemoglobin  15.2 (*)    Neutro Abs 8.3 (*)    All other components within normal limits  ETHANOL  RAPID URINE DRUG SCREEN, HOSP PERFORMED  I-STAT BETA HCG BLOOD, ED ([facility], [facility], [facility] ONLY)    EKG None  Radiology CT RENAL STONE STUDY  Result Date: 09/17/2020 CLINICAL DATA:  Flank pain lower abdominal pain EXAM: CT ABDOMEN AND PELVIS WITHOUT CONTRAST TECHNIQUE: Multidetector CT imaging of the abdomen and pelvis was performed following  the standard protocol without IV contrast. COMPARISON:  None. FINDINGS: Lower chest: Lung bases demonstrate no acute consolidation or effusion. Normal cardiac size. Hepatobiliary: No focal liver abnormality is seen. No gallstones, gallbladder wall thickening, or biliary dilatation. Pancreas: Unremarkable. No pancreatic ductal dilatation or surrounding inflammatory changes. Spleen: Normal in size without focal abnormality. Adrenals/Urinary Tract: Adrenal glands are unremarkable. Kidneys are normal, without renal calculi, focal lesion, or hydronephrosis. Bladder is unremarkable. Stomach/Bowel: Stomach is within normal limits. Appendix appears normal. No evidence of bowel wall thickening, distention, or inflammatory changes. Vascular/Lymphatic: Mild aortic atherosclerosis. No aneurysm. No suspicious nodes. Reproductive: Uterus and bilateral adnexa are unremarkable. Other: Negative for free air or free fluid. Musculoskeletal: No acute or significant osseous findings. IMPRESSION: No CT evidence for acute intra-abdominal or pelvic abnormality. Electronically Signed   By: Jasmine Pang M.D.   On: 09/17/2020 00:01    Procedures Procedures   Medications Ordered in ED Medications - No data to display  ED Course  I have reviewed the triage vital signs and the nursing notes.  Pertinent labs & imaging results that were available during my care of the patient were reviewed by me and considered in my medical decision making (see chart for details).    MDM Rules/Calculators/A&P                           Depressed mood.  No longer taking meds.  Not a danger to herself or others.  Feels safe for outpatient management.  Given return precautions outpatient follow-up for behavioral health urgent care.  Labs obtained at triage are unremarkable after my review Final Clinical Impression(s) / ED Diagnoses Final diagnoses:  Depression, unspecified depression type    Rx / DC Orders ED Discharge Orders    None       Sabino Donovan, MD 09/18/20 2313

## 2020-09-18 NOTE — ED Provider Notes (Signed)
Emergency Medicine Provider Triage Evaluation Note  Emily Horn , a 45 y.o. female  was evaluated in triage.  Pt complains of paranoia   Review of Systems  Positive: Depression, paranoia Negative: SI/HI, AVH  Physical Exam  BP (!) 172/86 (BP Location: Left Arm)   Pulse 77   Temp 98.4 F (36.9 C) (Oral)   Resp 17   Ht 5\' 7"  (1.702 m)   Wt 127 kg   LMP 09/14/2020   SpO2 94%   BMI 43.85 kg/m  Gen:   Awake, no distress   Resp:  Normal effort  MSK:   Moves extremities without difficulty  Other:    Medical Decision Making  Medically screening exam initiated at 8:14 PM.  Appropriate orders placed.  Emily Horn was informed that the remainder of the evaluation will be completed by another provider, this initial triage assessment does not replace that evaluation, and the importance of remaining in the ED until their evaluation is complete.  Repot hx of bipolar and schizophrenia here with increase depression and paranoia for the past few days.  Have been without her psych meds for several months.  Denies SI/HI/AVH   Gunnar Fusi, PA-C 09/18/20 2015    11/18/20, MD 09/18/20 2246

## 2020-09-18 NOTE — Discharge Instructions (Addendum)
Follow-up with the behavioral health urgent care anytime any day.  Return to [place] with any concerning changes that they cannot manage for you.

## 2020-09-19 ENCOUNTER — Other Ambulatory Visit: Payer: Self-pay

## 2020-09-19 ENCOUNTER — Encounter (HOSPITAL_COMMUNITY): Payer: Self-pay

## 2020-09-19 ENCOUNTER — Emergency Department (HOSPITAL_COMMUNITY)
Admission: EM | Admit: 2020-09-19 | Discharge: 2020-09-19 | Disposition: A | Discharge status: Home / Self Care | Payer: 59 | Attending: Emergency Medicine | Admitting: Emergency Medicine

## 2020-09-19 DIAGNOSIS — I10 Essential (primary) hypertension: Secondary | ICD-10-CM | POA: Insufficient documentation

## 2020-09-19 DIAGNOSIS — Z79899 Other long term (current) drug therapy: Secondary | ICD-10-CM | POA: Diagnosis not present

## 2020-09-19 DIAGNOSIS — R42 Dizziness and giddiness: Secondary | ICD-10-CM | POA: Diagnosis not present

## 2020-09-19 DIAGNOSIS — F17210 Nicotine dependence, cigarettes, uncomplicated: Secondary | ICD-10-CM | POA: Diagnosis not present

## 2020-09-19 DIAGNOSIS — K5900 Constipation, unspecified: Secondary | ICD-10-CM | POA: Insufficient documentation

## 2020-09-19 LAB — URINALYSIS, ROUTINE W REFLEX MICROSCOPIC
Bilirubin Urine: NEGATIVE
Glucose, UA: NEGATIVE mg/dL
Hgb urine dipstick: NEGATIVE
Ketones, ur: NEGATIVE mg/dL
Nitrite: POSITIVE — AB
Protein, ur: NEGATIVE mg/dL
Specific Gravity, Urine: 1.024 (ref 1.005–1.030)
pH: 6 (ref 5.0–8.0)

## 2020-09-19 LAB — I-STAT BETA HCG BLOOD, ED (MC, WL, AP ONLY): I-stat hCG, quantitative: <5 m[IU]/mL (ref <5)

## 2020-09-19 LAB — BASIC METABOLIC PANEL
Anion gap: 11 (ref 5–15)
BUN: 10 mg/dL (ref 6–20)
CO2: 27 mmol/L (ref 22–32)
Calcium: 9.4 mg/dL (ref 8.9–10.3)
Chloride: 98 mmol/L (ref 98–111)
Creatinine, Ser: 0.95 mg/dL (ref 0.44–1.00)
GFR, Estimated: >60 mL/min (ref >60)
Glucose, Bld: 133 mg/dL — ABNORMAL HIGH (ref 70–99)
Potassium: 3.5 mmol/L (ref 3.5–5.1)
Sodium: 136 mmol/L (ref 135–145)

## 2020-09-19 LAB — CBC
HCT: 45.7 % (ref 36.0–46.0)
Hemoglobin: 15.4 g/dL — ABNORMAL HIGH (ref 12.0–15.0)
MCH: 31.2 pg (ref 26.0–34.0)
MCHC: 33.7 g/dL (ref 30.0–36.0)
MCV: 92.5 fL (ref 80.0–100.0)
Platelets: 304 10*3/uL (ref 150–400)
RBC: 4.94 MIL/uL (ref 3.87–5.11)
RDW: 13.4 % (ref 11.5–15.5)
WBC: 12.8 10*3/uL — ABNORMAL HIGH (ref 4.0–10.5)
nRBC: 0 % (ref 0.0–0.2)

## 2020-09-19 NOTE — ED Provider Notes (Signed)
[facility] EMERGENCY DEPARTMENT Provider Note   CSN: [id number] Arrival date & time: 09/19/20  1150     History No chief complaint on file.   Emily Horn is a 45 y.o. female.  The history is provided by the patient and medical records.   Emily Horn is a 45 y.o. female who presents to the Emergency Department complaining of constipation. She reports 2 to 3 days of constipation. She is still able to pass flatus. She has associated mild right lower quadrant discomfort. No reports of nausea, vomiting, dysuria. No reported fevers at home. No chest pain, difficulty breathing. She does state that her urine looked orange today. She also complains of feeling dizzy. She has difficulty describing this well but states that she has been constantly dizzy for the last several days. She was recently seen in the emergency department and diagnosed with UTI and was started on Keflex and pyridium.  She is compliant with medication since hospital discharge. Denies SI    Past Medical History:  Diagnosis Date  . Chest heaviness   . Chest pain   . Exertional dyspnea   . Hypertension   . Mild cardiomegaly   . Nausea   . Swelling of limb     There are no problems to display for this patient.   Past Surgical History:  Procedure Laterality Date  . 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 THYROIDECTOMY  04/2001  . TONSILLECTOMY AND ADENOIDECTOMY  08/1992  . TUBAL LIGATION  11/1996  . VAGINAL MASS EXCISION  08/2017     OB History   No obstetric history on file.     Family History  Problem Relation Age of Onset  . Heart disease Father   . Heart attack Father 68  . Diabetes Father   . Vision loss Father   . Hypertension Father   . Vision loss Mother   . Diabetes type II Mother   . Hypercholesterolemia Mother   . Cancer Brother   . Breast cancer Maternal Grandmother 43  . Heart disease Maternal Grandfather   . Hypertension Maternal Grandfather   . Colon cancer  Paternal Grandmother   . Throat cancer Paternal Grandfather     Social History   Tobacco Use  . Smoking status: Current Every Day Smoker    Types: Cigarettes  . Smokeless tobacco: Never Used  Substance Use Topics  . Alcohol use: Yes  . Drug use: Yes    Home Medications Prior to Admission medications   Medication Sig Start Date End Date Taking? Authorizing Provider  ARIPiprazole (ABILIFY) 30 MG tablet Take 30 mg by mouth daily. 12/25/19   [provider]  cephALEXin (KEFLEX) 500 MG capsule Take 1 capsule (500 mg total) by mouth 2 (two) times daily. 09/17/20   Gilda Crease, MD  diphenhydrAMINE (BENADRYL) 25 MG tablet Take 1 tablet (25 mg total) by mouth every 6 (six) hours as needed. 06/04/20   Fayrene Helper, PA-C  doxycycline (VIBRAMYCIN) 100 MG capsule Take 100 mg by mouth 2 (two) times daily. 06/09/20   [provider]  levothyroxine (SYNTHROID) 175 MCG tablet Take 175 mcg by mouth every morning. 12/03/19   [provider]  lisinopril-hydrochlorothiazide (ZESTORETIC) 20-12.5 MG tablet Take 1 tablet by mouth daily. 12/24/19   [provider]  phenazopyridine (PYRIDIUM) 200 MG tablet Take 1 tablet (200 mg total) by mouth 3 (three) times daily as needed for pain. 09/17/20   Gilda Crease, MD  potassium  chloride SA (KLOR-CON) 20 MEQ tablet Take one tablet twice daily for the next 5 days, then once daily after that 06/17/20   Elpidio Anis, PA-C  predniSONE (DELTASONE) 20 MG tablet 3 tabs po day one, then 2 po daily x 4 days 06/04/20   Fayrene Helper, PA-C  rosuvastatin (CRESTOR) 20 MG tablet Take 20 mg by mouth daily. 12/24/19   [provider]  sertraline (ZOLOFT) 100 MG tablet Take 100 mg by mouth 2 (two) times daily. 12/24/19   [provider]    Allergies    Ziprasidone and Aspirin  Review of Systems   Review of Systems  All other systems reviewed and are negative.   Physical Exam Updated Vital Signs BP (!) 141/76 (BP  Location: Left Arm)   Pulse 84   Temp 99.2 F (37.3 C) (Oral)   Resp 17   LMP 09/14/2020   SpO2 92%   Physical Exam Vitals and nursing note reviewed.  Constitutional:      Appearance: She is well-developed.  HENT:     Head: Normocephalic and atraumatic.  Cardiovascular:     Rate and Rhythm: Normal rate and regular rhythm.     Heart sounds: No murmur heard.   Pulmonary:     Effort: Pulmonary effort is normal. No respiratory distress.     Breath sounds: Normal breath sounds.  Abdominal:     Palpations: Abdomen is soft.     Tenderness: There is no abdominal tenderness. There is no guarding or rebound.     Comments: Mild generalized abdominal tenderness  Musculoskeletal:        General: No swelling or tenderness.  Skin:    General: Skin is warm and dry.  Neurological:     Mental Status: She is alert and oriented to person, place, and time.     Comments: Five out of five strength in all four extremities with sensational light touch intact in all four extremities. Pupils equal round and reactive. EOM I. No asymmetry of facial movements. No pronator drift.  Psychiatric:     Comments: Poor eye contact     ED Results / Procedures / Treatments   Labs (all labs ordered are listed, but only abnormal results are displayed) Labs Reviewed  BASIC METABOLIC PANEL - Abnormal; Notable for the following components:      Result Value   Glucose, Bld 133 (*)    All other components within normal limits  CBC - Abnormal; Notable for the following components:   WBC 12.8 (*)    Hemoglobin 15.4 (*)    All other components within normal limits  URINALYSIS, ROUTINE W REFLEX MICROSCOPIC - Abnormal; Notable for the following components:   Color, Urine AMBER (*)    APPearance HAZY (*)    Nitrite POSITIVE (*)    Leukocytes,Ua TRACE (*)    Bacteria, UA RARE (*)    All other components within normal limits  I-STAT BETA HCG BLOOD, ED ([facility], [facility], [facility] ONLY)    EKG EKG  Interpretation  Date/Time:  Saturday September 19 2020 12:37:16 EDT Ventricular Rate:  80 PR Interval:  168 QRS Duration: 94 QT Interval:  410 QTC Calculation: 472 R Axis:   0 Text Interpretation: Normal sinus rhythm Cannot rule out Anterior infarct , age undetermined Abnormal ECG Confirmed by Tilden Fossa [phone]) on 09/19/2020 3:36:32 PM   Radiology No results found.  Procedures Procedures   Medications Ordered in ED Medications - No data to display  ED Course  I have  reviewed the triage vital signs and the nursing notes.  Pertinent labs & imaging results that were available during my care of the patient were reviewed by me and considered in my medical decision making (see chart for details).    MDM Rules/Calculators/A&P                         Patient here for evaluation of three days of constipation, dizziness. She is non-toxic appearing on evaluation with no peritoneal findings on abdominal examination. She is also neurologically intact. She was recently treated with antibiotics for UTI. Urinalysis today is consistent with improving UTI. She does have mild leukocytosis on CBC, similar when compared to priors. Presentation is not consistent with sepsis, CVA, acute appendicitis, renal abscess. Discussed with patient home care for constipation with oral fluid hydration as well as taking over-the-counter medications for symptom management.   Final Clinical Impression(s) / ED Diagnoses Final diagnoses:  Constipation, unspecified constipation type    Rx / DC Orders ED Discharge Orders    None       Tilden Fossa, MD 09/19/20 [phone]

## 2020-09-19 NOTE — Discharge Instructions (Addendum)
You may take MiraLAX, available over-the-counter. You may take 2 to 4 doses over the next two days and then return to one dose daily. You may also take Senokot, available over-the-counter according to package instructions.

## 2020-09-19 NOTE — ED Triage Notes (Signed)
Patient complains of dizziness and constipation x 2-3 days. States that she lower abd pain and rectal pain and has not taken anything for the symptoms. Alert and oriented, NAD

## 2022-05-02 IMAGING — CT CT RENAL STONE PROTOCOL
2 of 4 series · 17 of 46 positions shown, 19 images · non-contrast
Comparison: None.

CLINICAL DATA: Flank pain lower abdominal pain

EXAM:
CT ABDOMEN AND PELVIS WITHOUT CONTRAST
TECHNIQUE: Multidetector CT imaging of the abdomen and pelvis was performed
following the standard protocol without IV contrast.

[Series 2: axial st · axial · 0.82mm/px · z∈[+979,+1399]mm · 14 of 96 slices shown, 16 images]
[im 6/96  soft-tissue]
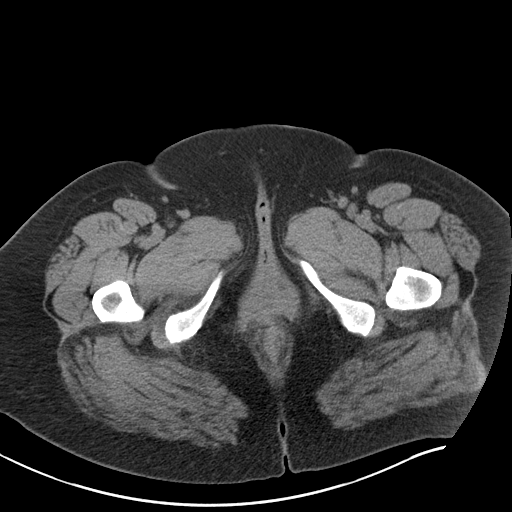
[im 6/96  bone]
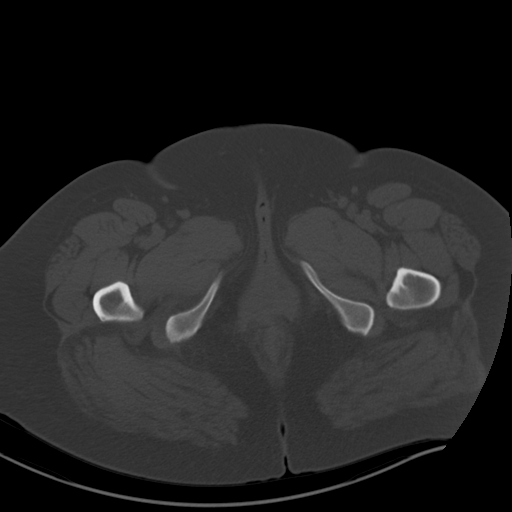
[im 12/96  soft-tissue]
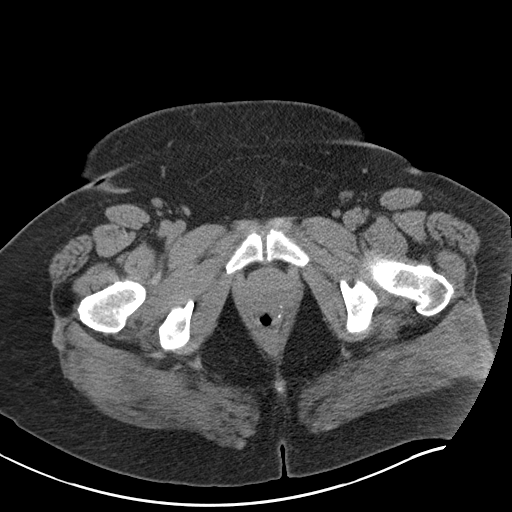
[im 17/96  soft-tissue]
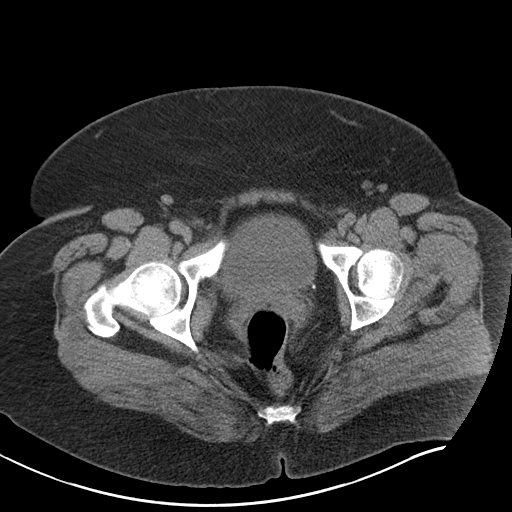
[im 28/96  soft-tissue]
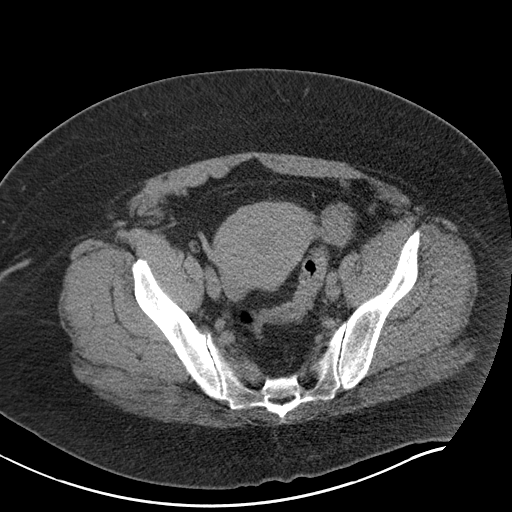
[im 34/96  soft-tissue]
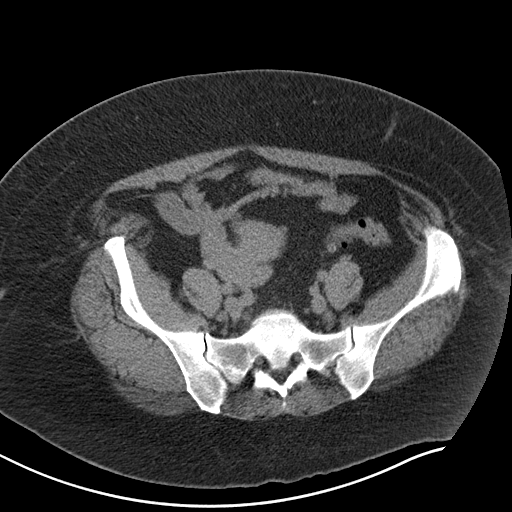
[im 40/96  soft-tissue]
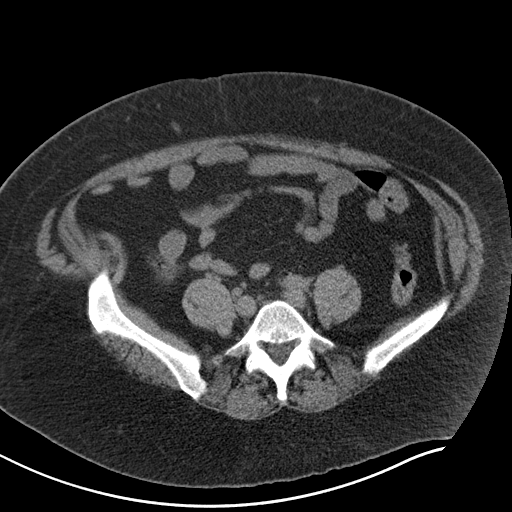
[im 45/96  soft-tissue]
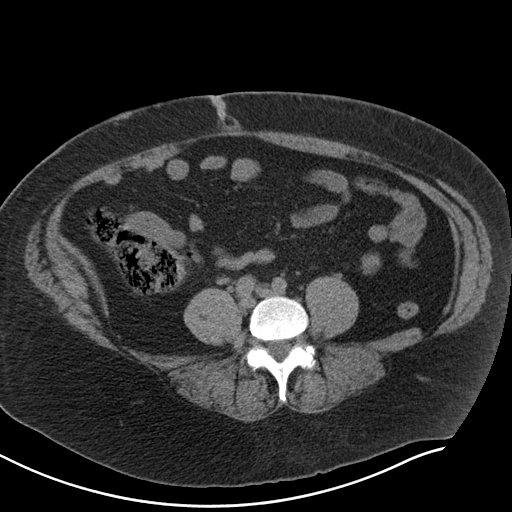
[im 51/96  soft-tissue]
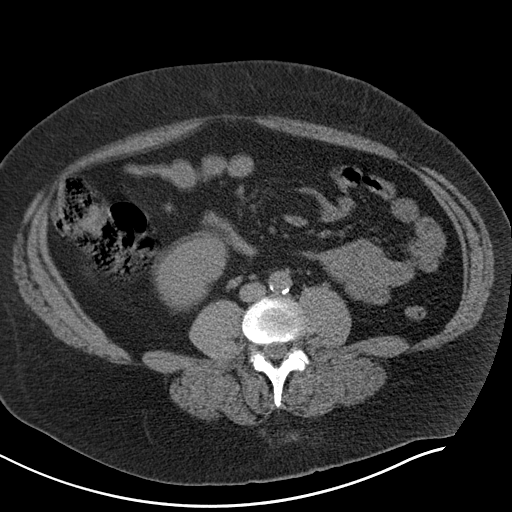
[im 56/96  soft-tissue]
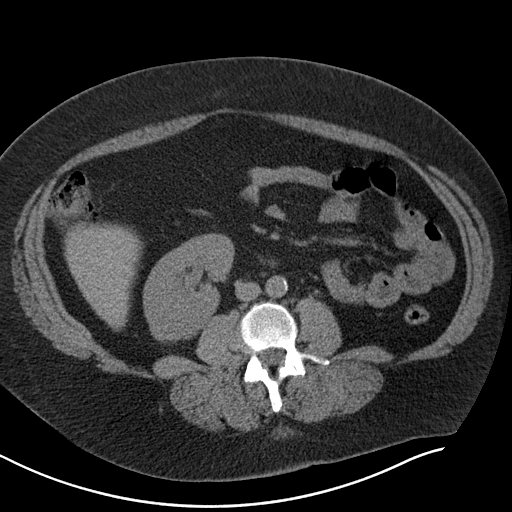
[im 56/96  bone]
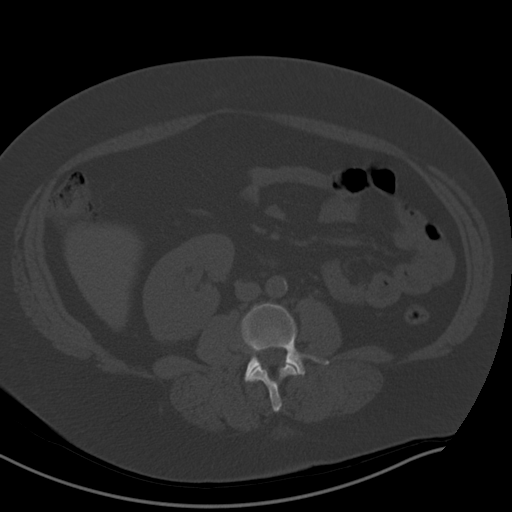
[im 62/96  soft-tissue]
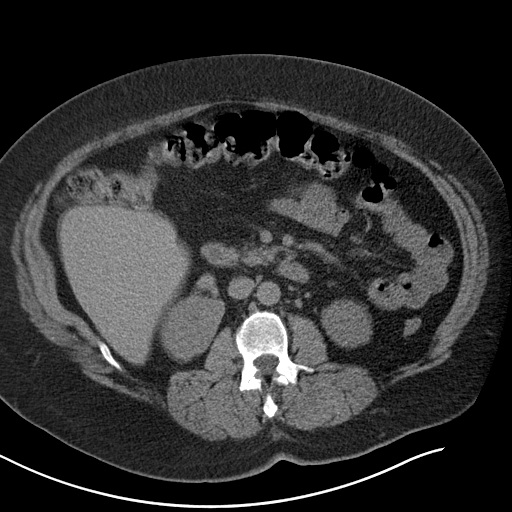
[im 73/96  soft-tissue]
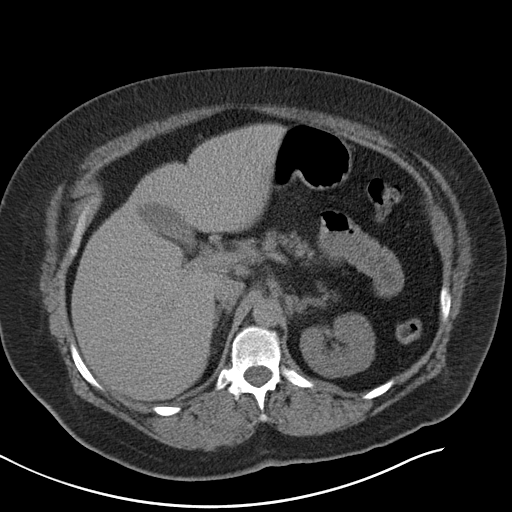
[im 79/96  soft-tissue]
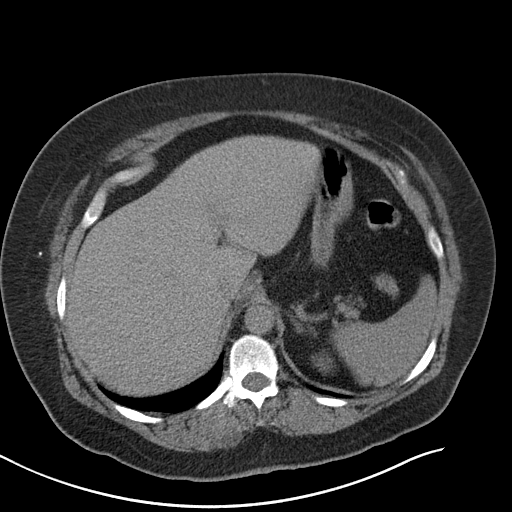
[im 84/96  soft-tissue]
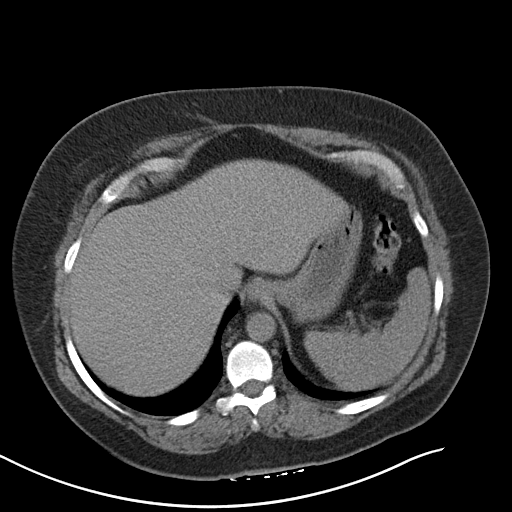
[im 90/96  soft-tissue]
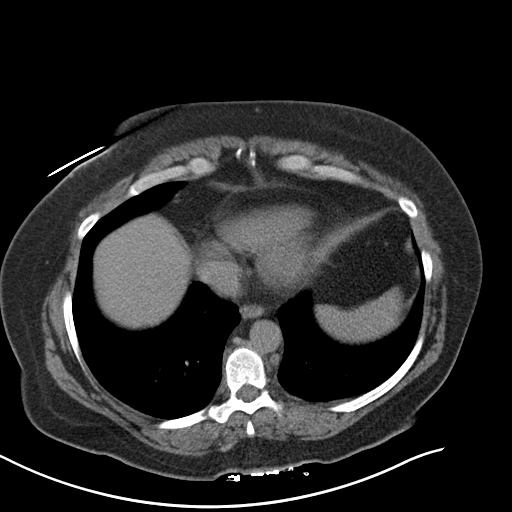

[Series 5: coronal · coronal · 0.94mm/px · 3 of 170 slices shown]
[im 57/170  soft-tissue]
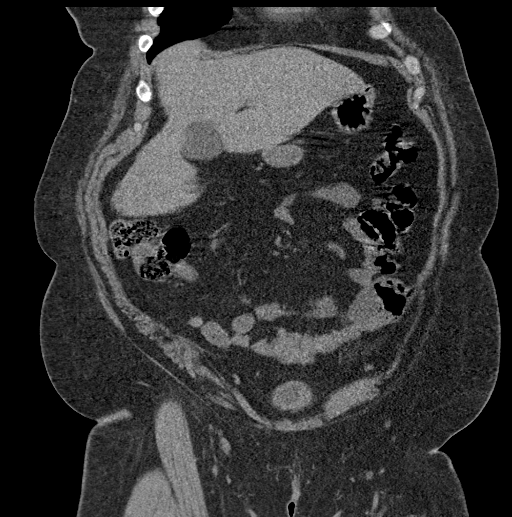
[im 76/170  soft-tissue]
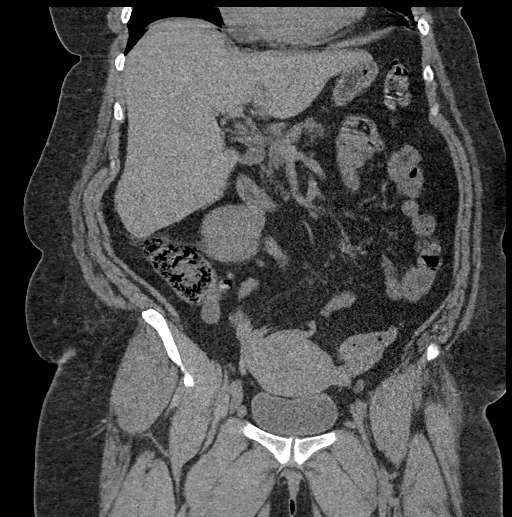
[im 94/170  soft-tissue]
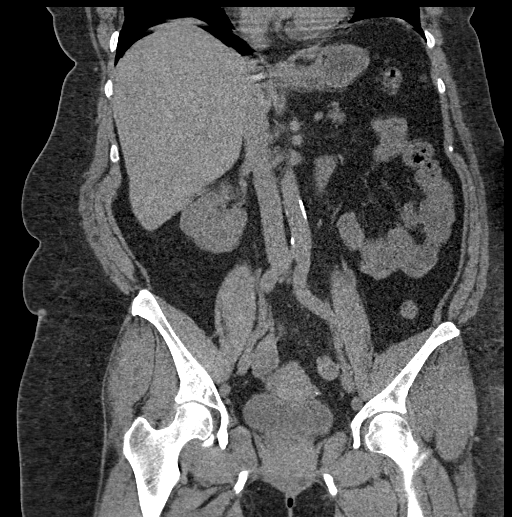

[17 of 46 positions shown; findings below may reference images not displayed]

FINDINGS: Lower chest: Lung bases demonstrate no acute consolidation or
effusion. Normal cardiac size.

Hepatobiliary: No focal liver abnormality is seen. No gallstones,
gallbladder wall thickening, or biliary dilatation.

Pancreas: Unremarkable. No pancreatic ductal dilatation or
surrounding inflammatory changes.

Spleen: Normal in size without focal abnormality.

Adrenals/Urinary Tract: Adrenal glands are unremarkable. Kidneys are
normal, without renal calculi, focal lesion, or hydronephrosis.
Bladder is unremarkable.

Stomach/Bowel: Stomach is within normal limits. Appendix appears
normal. No evidence of bowel wall thickening, distention, or
inflammatory changes.

Vascular/Lymphatic: Mild aortic atherosclerosis. No aneurysm. No
suspicious nodes.

Reproductive: Uterus and bilateral adnexa are unremarkable.

Other: Negative for free air or free fluid.

Musculoskeletal: No acute or significant osseous findings.
IMPRESSION: No CT evidence for acute intra-abdominal or pelvic abnormality.
